# Patient Record
Sex: Male | Born: 2000
Health system: Southern US, Community
[De-identification: ages and names within clinical notes are randomized; demographics above are authoritative.]

## PROBLEM LIST (undated history)

## (undated) ENCOUNTER — Ambulatory Visit (HOSPITAL_COMMUNITY): Disposition: A | Discharge status: Home / Self Care | Payer: Self-pay

## (undated) DIAGNOSIS — J302 Other seasonal allergic rhinitis: Secondary | ICD-10-CM

## (undated) HISTORY — PX: APPENDECTOMY: SHX54

---
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
## 2021-01-06 DIAGNOSIS — Z0100 Encounter for examination of eyes and vision without abnormal findings: Secondary | ICD-10-CM | POA: Diagnosis not present

## 2021-01-10 DIAGNOSIS — H5213 Myopia, bilateral: Secondary | ICD-10-CM | POA: Diagnosis not present

## 2021-01-27 DIAGNOSIS — Z0100 Encounter for examination of eyes and vision without abnormal findings: Secondary | ICD-10-CM | POA: Diagnosis not present

## 2021-01-31 ENCOUNTER — Other Ambulatory Visit: Payer: Self-pay

## 2021-01-31 MED ORDER — CETIRIZINE HCL 10 MG PO TABS: 10.0000 mg | ORAL_TABLET | Freq: Every day | ORAL | 3 refills | Status: DC

## 2021-03-15 ENCOUNTER — Ambulatory Visit (HOSPITAL_COMMUNITY): Payer: Self-pay

## 2021-09-30 ENCOUNTER — Ambulatory Visit (HOSPITAL_COMMUNITY)
Admission: EM | Admit: 2021-09-30 | Discharge: 2021-09-30 | Disposition: A | Discharge status: Home / Self Care | Payer: Medicaid Other | Attending: Student | Admitting: Student

## 2021-09-30 ENCOUNTER — Other Ambulatory Visit: Payer: Self-pay

## 2021-09-30 ENCOUNTER — Ambulatory Visit (HOSPITAL_COMMUNITY)
Admission: EM | Admit: 2021-09-30 | Discharge: 2021-09-30 | Disposition: A | Discharge status: Home / Self Care | Payer: Self-pay

## 2021-09-30 ENCOUNTER — Encounter (HOSPITAL_COMMUNITY): Payer: Self-pay

## 2021-09-30 DIAGNOSIS — R21 Rash and other nonspecific skin eruption: Secondary | ICD-10-CM | POA: Diagnosis not present

## 2021-09-30 MED ORDER — PREDNISONE 20 MG PO TABS: 20.0000 mg | ORAL_TABLET | Freq: Every day | ORAL | 0 refills | Status: AC

## 2021-09-30 MED ORDER — DIPHENHYDRAMINE HCL 25 MG PO TABS: 25.0000 mg | ORAL_TABLET | Freq: Four times a day (QID) | ORAL | 0 refills | Status: DC | PRN

## 2021-09-30 NOTE — ED Provider Notes (Signed)
[facility]    CSN: [id number] Arrival date & time: 09/30/21  1943      History   Chief Complaint Chief Complaint  Patient presents with   Rash    HPI Dale Moyer is a 20 y.o. male presenting with urticarial rash for about 1 week.  Medical history noncontributory.  Describes itchy rash on back, arms, chest for about 1 week.  Has not tried any interventions for the symptoms.  He did have viral symptoms before this started, including cough and congestion, but this has resolved.  He is VERY concerned for monkey pox.  He is adamant that he is not having unprotected anal intercourse with men, and denies other unprotected sexual intercourse.  Denies facial involvement, though there is a small uncomfortable lesion under his tongue. Denies changes in routine or new products.  HPI  Past Medical History:  Diagnosis Date   Seasonal allergies     Patient Active Problem List   Diagnosis Date Noted   Generalized body aches 11/13/2020   External hemorrhoids without complication 11/13/2020   Depression, major, single episode, moderate (HCC) 12/28/2019   Sprain of right rotator cuff capsule 12/28/2019   Seasonal allergies 09/07/2017    Past Surgical History:  Procedure Laterality Date   APPENDECTOMY         Home Medications    Prior to Admission medications   Medication Sig Start Date End Date Taking? Authorizing Provider  diphenhydrAMINE (BENADRYL) 25 MG tablet Take 1 tablet (25 mg total) by mouth every 6 (six) hours as needed. This medication can cause drowsiness. 09/30/21  Yes Rhys Martini, PA-C  predniSONE (DELTASONE) 20 MG tablet Take 1 tablet (20 mg total) by mouth daily for 5 days. 09/30/21 10/05/21 Yes Rhys Martini, PA-C  cetirizine (ZYRTEC) 10 MG tablet Take 1 tablet (10 mg total) by mouth daily. 01/31/21   Shirlean Mylar, MD  ibuprofen (ADVIL) 200 MG tablet Take 2 tablets (400 mg total) by mouth every 6 (six) hours as needed for mild pain or moderate  pain. 12/28/19   Garnette Gunner, MD    Family History Family History  Problem Relation Age of Onset   Hypertension Mother    Hypertension Father    Diabetes Maternal Grandmother    Cancer - Cervical Maternal Grandmother    Diabetes Maternal Grandfather    Heart attack Maternal Grandfather    Heart disease Maternal Uncle    Breast cancer Maternal Aunt     Social History Social History   Tobacco Use   Smoking status: Every Day    Types: E-cigarettes   Smokeless tobacco: Never  Substance Use Topics   Alcohol use: Yes   Drug use: Yes    Frequency: 7.0 times per week    Types: Marijuana     Allergies   Patient has no known allergies.   Review of Systems Review of Systems  Skin:  Positive for rash.  All other systems reviewed and are negative.   Physical Exam Triage Vital Signs ED Triage Vitals  Enc Vitals Group     BP 09/30/21 2018 114/71     Pulse Rate 09/30/21 2018 85     Resp 09/30/21 2018 16     Temp 09/30/21 2018 98.9 F (37.2 C)     Temp Source 09/30/21 2018 Oral     SpO2 --      Weight --      Height --      Head Circumference --  Peak Flow --      Pain Score 09/30/21 2017 0     Pain Loc --      Pain Edu? --      Excl. in GC? --    No data found.  Updated Vital Signs BP 114/71 (BP Location: Left Arm)   Pulse 85   Temp 98.9 F (37.2 C) (Oral)   Resp 16   Visual Acuity Right Eye Distance:   Left Eye Distance:   Bilateral Distance:    Right Eye Near:   Left Eye Near:    Bilateral Near:     Physical Exam Vitals reviewed.  Constitutional:      General: He is not in acute distress.    Appearance: Normal appearance. He is not ill-appearing or diaphoretic.  HENT:     Head: Normocephalic and atraumatic.     Mouth/Throat:     Comments: Small canker sore under tongue- frenulum. No trismus, drooling, sore throat, voice changes, swelling underneath the tongue, swelling underneath the jaw, neck stiffness.  Cardiovascular:     Rate  and Rhythm: Normal rate and regular rhythm.     Heart sounds: Normal heart sounds.  Pulmonary:     Effort: Pulmonary effort is normal.     Breath sounds: Normal breath sounds.  Skin:    General: Skin is warm.     Comments: Scattered faint maculopapular rash back, chest, forearms. No facial involvement or swelling, airway is patent. No burrows interdigital webs.  Neurological:     General: No focal deficit present.     Mental Status: He is alert and oriented to person, place, and time.  Psychiatric:        Mood and Affect: Mood is anxious.        Behavior: Behavior normal.        Thought Content: Thought content normal.        Judgment: Judgment normal.       UC Treatments / Results  Labs (all labs ordered are listed, but only abnormal results are displayed) Labs Reviewed - No data to display  EKG   Radiology No results found.  Procedures Procedures (including critical care time)  Medications Ordered in UC Medications - No data to display  Initial Impression / Assessment and Plan / UC Course  I have reviewed the triage vital signs and the nursing notes.  Pertinent labs & imaging results that were available during my care of the patient were reviewed by me and considered in my medical decision making (see chart for details).     This patient is a very pleasant 20 y.o. year old male presenting with what appears to be contact dermatitis. He is VERY concerned for monkeypox. Discussed that he is low risk and symptoms are not consistent with this. He is in agreement that we will defer testing at this time. Low-dose prednisone and benedryl as below. ED return precautions discussed. Patient verbalizes understanding and agreement.  .   Final Clinical Impressions(s) / UC Diagnoses   Final diagnoses:  Rash and nonspecific skin eruption     Discharge Instructions      -Prednisone,  1 pill taken in the morning x5 days. Try taking this earlier in the day as it can give you  energy. Avoid NSAIDs like ibuprofen and alleve while taking this medication as they can increase your risk of stomach upset and even GI bleeding when in combination with a steroid. You can continue tylenol (acetaminophen) up to 1000mg  3x daily. -  Benedryl (diphenhydramine) 25-50mg  (1-2 pills) as needed for itching, up to every 6 hours.  This medication will cause drowsiness.     ED Prescriptions     Medication Sig Dispense Auth. Provider   predniSONE (DELTASONE) 20 MG tablet Take 1 tablet (20 mg total) by mouth daily for 5 days. 5 tablet Rhys Martini, PA-C   diphenhydrAMINE (BENADRYL) 25 MG tablet Take 1 tablet (25 mg total) by mouth every 6 (six) hours as needed. This medication can cause drowsiness. 30 tablet Rhys Martini, PA-C      PDMP not reviewed this encounter.   Rhys Martini, PA-C 09/30/21 2032

## 2021-09-30 NOTE — ED Triage Notes (Signed)
Pt reports itching rash stared in hand spread to ars and torso x 1 week; bump under the tongue.

## 2021-09-30 NOTE — Discharge Instructions (Addendum)
-  Prednisone,  1 pill taken in the morning x5 days. Try taking this earlier in the day as it can give you energy. Avoid NSAIDs like ibuprofen and alleve while taking this medication as they can increase your risk of stomach upset and even GI bleeding when in combination with a steroid. You can continue tylenol (acetaminophen) up to 1000mg  3x daily. -Benedryl (diphenhydramine) 25-50mg  (1-2 pills) as needed for itching, up to every 6 hours.  This medication will cause drowsiness.

## 2021-10-02 ENCOUNTER — Other Ambulatory Visit (HOSPITAL_COMMUNITY)
Admission: RE | Admit: 2021-10-02 | Discharge: 2021-10-02 | Disposition: A | Discharge status: Home / Self Care | Payer: Medicaid Other | Source: Ambulatory Visit | Attending: Family Medicine | Admitting: Family Medicine

## 2021-10-02 ENCOUNTER — Ambulatory Visit (INDEPENDENT_AMBULATORY_CARE_PROVIDER_SITE_OTHER): Payer: Medicaid Other | Admitting: Family Medicine

## 2021-10-02 ENCOUNTER — Other Ambulatory Visit: Payer: Self-pay

## 2021-10-02 VITALS — BP 116/74 | HR 70 | Ht 64.0 in | Wt 110.4 lb

## 2021-10-02 DIAGNOSIS — Z202 Contact with and (suspected) exposure to infections with a predominantly sexual mode of transmission: Secondary | ICD-10-CM | POA: Insufficient documentation

## 2021-10-02 DIAGNOSIS — R21 Rash and other nonspecific skin eruption: Secondary | ICD-10-CM

## 2021-10-02 DIAGNOSIS — K146 Glossodynia: Secondary | ICD-10-CM

## 2021-10-02 LAB — POCT RAPID STREP A (OFFICE): Rapid Strep A Screen: NEGATIVE

## 2021-10-02 MED ORDER — DOXYCYCLINE HYCLATE 100 MG PO TABS: 100.0000 mg | ORAL_TABLET | Freq: Two times a day (BID) | ORAL | 0 refills | Status: DC

## 2021-10-02 MED ORDER — VALACYCLOVIR HCL 1 G PO TABS: 1000.0000 mg | ORAL_TABLET | Freq: Two times a day (BID) | ORAL | 0 refills | Status: AC

## 2021-10-02 MED ORDER — CEFTRIAXONE SODIUM 250 MG IJ SOLR
250.0000 mg | Freq: Once | INTRAMUSCULAR | Status: AC
  Administered 2021-10-03: 250 mg via INTRAMUSCULAR

## 2021-10-02 MED ORDER — NYSTATIN 100000 UNIT/ML MT SUSP: 5.0000 mL | Freq: Three times a day (TID) | OROMUCOSAL | 0 refills | Status: DC

## 2021-10-02 NOTE — Progress Notes (Signed)
SUBJECTIVE:   CHIEF COMPLAINT / HPI: worsening rash  Rash started 5 days ago on both palms, then spread to arms, chest and back.  Initially palms itching but now more burning sensation.  Was seen in ED and treated with Prednisone and Benadryl for presumed contact dermatitis.  Patient reports taking one dose of steroid and this morning rash appears to be worse now on lower legs and soles of feet.  Reports subjective fever on initial day of breakout as well as swollen painful lymph nodes which has since resolved.  No recent travel outside country.  Was in [place] last week and attended friendsgiving.  Stayed with older brother who has no similar rash.  Reports no one else in family has similar symptoms.  Shares bed with younger brother who has no rash.  [place] has been there frequently before breakout.  No changes in body wash, lotions laundry detergents.  Sexually active with females only, does not partake in oral sex.  Last sexual encounter 1-2 weeks ago without condom use.  3 sexual partners in last 6 months and condom use with one.  Unknown if partners have any similar symptoms.  Reports a small non tender pimple with white head on dorsum of penile shaft about 5 months ago that popped and resolved.  Initially had not reported any mucosal concerns but when mask removed noted perioral erythema and yellow crusting.  Patient reports that lips are dry and cracks because he bites lips.  He reports he has been having some difficulty swallowing due to painful mouth.    PERTINENT  PMH / PSH:  Vaping THC use   OBJECTIVE:   BP 116/74   Pulse 70   Ht 5\' 4"  (1.626 m)   Wt 110 lb 6.4 oz (50.1 kg)   SpO2 100%   BMI 18.95 kg/m    General: Alert, no acute distress HEENT: periorbital erythema, cracking on lips, mucus membranes notable for multiple oral ulcers, possible herpetic lesions with sloughing mucosal tissue extending to posterior pharynx.  No  lymphadenopathy appreciated           Cardio: Normal S1 and S2, RRR, no r/m/g Pulm: CTAB, normal work of breathing Abdomen: Bowel sounds normal. Abdomen soft and non-tender. No hepatosplenomegaly Derm: Diffuse maculopapular rash extending from trunk to lower extremities, including palmer surface of hands bilaterally and bilateral soles of feet.                         ASSESSMENT/PLAN:   Rash Diffuse rash on body for 5 days with subjective fever also affecting palmar surface of hand and soles of feet.  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  Other differentials include monkey pox, HIV, HSV and GC/C.  Considered Bechets given penile lesion and mouth sores but less likely given no joint involvement.  -Monkeypox PCR, RPR, HIV, HSV of left wrist, HSV oral swab, GC &C oral swab, Urine GC&C, Rapid Strep, CBC, CMet today -Magic mouthwash TID x 5 days -Spoke with Dr , ID.  She has reviewed images and agrees that most likely syphilis.  Recommends treating with Antiviral for HSV, prophylactic treatment with Ceftriaxone and Doxycycline for GC&C and urgent care referral to ID in am. -Valcyclovir 1g BID x 10 days -Ceftriaxone 500 mg IM x1, patient will come to RN clinic  tomorrow at 9am to receive injection.  He is aware of this.   -Doxycycline 100 mg BID x 7 days -Referral sent to ID, have sent message to CMA to make urgent appointment in am.   -Discontinue Steroids -Can continue benadryl at night if itching -Will wait for RPR titre before initiation of Bicillin. -Follow up up appointment scheduled for 11/21 with PCP.       Dana Allan, MD [facility]

## 2021-10-02 NOTE — Patient Instructions (Signed)
Thank you for coming to see me today. It was a pleasure.   We will get some labs today.  If they are abnormal or we need to do something about them, I will call you.  If they are normal, I will send you a message on MyChart (if it is active) or a letter in the mail.  If you don't hear from [place] in 2 weeks, please call the office at the number below.   Please follow-up with PCP as needed  If you have any questions or concerns, please do not hesitate to call the office at [phone].  Best,   Dana Allan, MD

## 2021-10-02 NOTE — Progress Notes (Signed)
stre

## 2021-10-03 ENCOUNTER — Ambulatory Visit (INDEPENDENT_AMBULATORY_CARE_PROVIDER_SITE_OTHER): Payer: Medicaid Other | Admitting: Internal Medicine

## 2021-10-03 ENCOUNTER — Telehealth: Payer: Self-pay

## 2021-10-03 ENCOUNTER — Ambulatory Visit (INDEPENDENT_AMBULATORY_CARE_PROVIDER_SITE_OTHER): Payer: Medicaid Other

## 2021-10-03 ENCOUNTER — Encounter: Payer: Self-pay | Admitting: Family Medicine

## 2021-10-03 ENCOUNTER — Other Ambulatory Visit: Payer: Self-pay

## 2021-10-03 VITALS — BP 117/82 | HR 86 | Temp 97.7°F | Resp 17 | Ht 64.0 in | Wt 111.6 lb

## 2021-10-03 DIAGNOSIS — R21 Rash and other nonspecific skin eruption: Secondary | ICD-10-CM

## 2021-10-03 DIAGNOSIS — Z202 Contact with and (suspected) exposure to infections with a predominantly sexual mode of transmission: Secondary | ICD-10-CM | POA: Diagnosis not present

## 2021-10-03 LAB — CBC WITH DIFFERENTIAL/PLATELET
Basophils Absolute: 0 10*3/uL (ref 0.0–0.2)
Basos: 0 %
EOS (ABSOLUTE): 0 10*3/uL (ref 0.0–0.4)
Eos: 0 %
Hematocrit: 42.5 % (ref 37.5–51.0)
Hemoglobin: 14.7 g/dL (ref 13.0–17.7)
Immature Grans (Abs): 0 10*3/uL (ref 0.0–0.1)
Immature Granulocytes: 0 %
Lymphocytes Absolute: 1.3 10*3/uL (ref 0.7–3.1)
Lymphs: 19 %
MCH: 31.1 pg (ref 26.6–33.0)
MCHC: 34.6 g/dL (ref 31.5–35.7)
MCV: 90 fL (ref 79–97)
Monocytes Absolute: 0.6 10*3/uL (ref 0.1–0.9)
Monocytes: 9 %
Neutrophils Absolute: 4.9 10*3/uL (ref 1.4–7.0)
Neutrophils: 72 %
Platelets: 243 10*3/uL (ref 150–450)
RBC: 4.73 x10E6/uL (ref 4.14–5.80)
RDW: 11.9 % (ref 11.6–15.4)
WBC: 6.9 10*3/uL (ref 3.4–10.8)

## 2021-10-03 LAB — T PALLIDUM ANTIBODY, EIA: T pallidum Antibody, EIA: NEGATIVE

## 2021-10-03 LAB — COMPREHENSIVE METABOLIC PANEL
ALT: 20 IU/L (ref 0–44)
AST: 37 IU/L (ref 0–40)
Albumin/Globulin Ratio: 1.3 (ref 1.2–2.2)
Albumin: 5.1 g/dL (ref 4.1–5.2)
Alkaline Phosphatase: 62 IU/L (ref 51–125)
BUN/Creatinine Ratio: 18 (ref 9–20)
BUN: 13 mg/dL (ref 6–20)
Bilirubin Total: 0.9 mg/dL (ref 0.0–1.2)
CO2: 22 mmol/L (ref 20–29)
Calcium: 9.9 mg/dL (ref 8.7–10.2)
Chloride: 99 mmol/L (ref 96–106)
Creatinine, Ser: 0.71 mg/dL — ABNORMAL LOW (ref 0.76–1.27)
Globulin, Total: 3.8 g/dL (ref 1.5–4.5)
Glucose: 96 mg/dL (ref 70–99)
Potassium: 4.3 mmol/L (ref 3.5–5.2)
Sodium: 140 mmol/L (ref 134–144)
Total Protein: 8.9 g/dL — ABNORMAL HIGH (ref 6.0–8.5)
eGFR: 136 mL/min/{1.73_m2} (ref >59)

## 2021-10-03 LAB — RPR W/REFLEX TO TREPSURE: RPR: NONREACTIVE

## 2021-10-03 LAB — HIV ANTIBODY (ROUTINE TESTING W REFLEX): HIV Screen 4th Generation wRfx: NONREACTIVE

## 2021-10-03 MED ORDER — DOXYCYCLINE HYCLATE 100 MG PO TABS: 100.0000 mg | ORAL_TABLET | Freq: Two times a day (BID) | ORAL | 0 refills | Status: AC

## 2021-10-03 NOTE — Telephone Encounter (Signed)
Patient calls nurse line asking for clarification on magic mouth wash. Patient states the directions saw "swish and swallow." However patient has reservations about that. Patient advised to call his pharmacy for recommendations of use. Patient agreed with plan.

## 2021-10-03 NOTE — Addendum Note (Signed)
Addended by: Harley Alto on: 10/03/2021 11:53 AM   Modules accepted: Orders

## 2021-10-03 NOTE — Patient Instructions (Signed)
We don't know what this rash is, it could be viral or bacteria   Will swab your throat and get blood for more testing   Please sign up with my chart to follow your labs and send communication to [place] (or we to you)....    Based on the labs we can discuss treatment (if anything needed at all)  Otherwise follow up with [place] in around 6-8 weeks to test for other late-showing virus such as hepatitis testing

## 2021-10-03 NOTE — Progress Notes (Signed)
Patient presents to nurse clinic for IM ceftriaxone injections, per Dr. Clent Ridges.   Administered 250 mg ceftriaxone in RUOQ and 250 mg ceftriaxone in LUOQ.  Patient tolerated injections well. Observed 15 minutes post administration, no adverse reaction noted. Patient to follow up with Dr. Clent Ridges on 11/21.  Veronda Prude, RN

## 2021-10-03 NOTE — Assessment & Plan Note (Addendum)
Diffuse rash on body for 5 days with subjective fever also affecting palmar surface of hand and soles of feet.  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  Other differentials include monkey pox, HIV, HSV and GC/C.  Considered Bechets given penile lesion and mouth sores but less likely given no joint involvement.  -Monkeypox PCR, RPR, HIV, HSV of left wrist, HSV oral swab, GC &C oral swab, Urine GC&C, Rapid Strep, CBC, CMet today -Magic mouthwash TID x 5 days -Spoke with Dr Thedore Mins, ID.  She has reviewed images and agrees that most likely syphilis.  Recommends treating with Antiviral for HSV, prophylactic treatment with Ceftriaxone and Doxycycline for GC&C and urgent care referral to ID in am. -Valcyclovir 1g BID x 10 days -Ceftriaxone 500 mg IM x1, patient will come to RN clinic tomorrow at 9am to receive injection.  He is aware of this.   -Doxycycline 100 mg BID x 7 days -Referral sent to ID, have sent message to CMA to make urgent appointment in am.   -Discontinue Steroids -Can continue benadryl at night if itching -Will wait for RPR titre before initiation of Bicillin. -Follow up up appointment scheduled for 11/21 with PCP.

## 2021-10-03 NOTE — Telephone Encounter (Signed)
Perfect - thank you so much for the heads up!

## 2021-10-03 NOTE — Telephone Encounter (Signed)
Received message from Dr. Thedore Mins requesting urgent appointment to evaluate for monkeypox treatment. Patient has been seen by urgent care and family medicine. Monkeypox testing has already been collected and results are pending.   Patient accepts appointment for today with Dr. Renold Don.   Sandie Ano, RN

## 2021-10-03 NOTE — Progress Notes (Signed)
[place] [facility]  Reason for Consult:rash Referring Provider: self    Patient Active Problem List   Diagnosis Date Noted   Generalized body aches 11/13/2020   External hemorrhoids without complication 53/74/8270   Depression, major, single episode, moderate ([place]) 12/28/2019   Sprain of right rotator cuff capsule 12/28/2019   Seasonal allergies 09/07/2017      HPI: Dale Moyer is a 20 y.o. male with sexual exposure and 5 days of initially truncal/ext rash now involving mouth here for evaluation   Subjective f/c associated No headache Cervical node tenderness The rash now involved throat with mucositis  Palms sole involved  Saw ed and fam practice clinic within last 2 days pending rpr, monkey pox testing, hsv swab and also gc/chlam screening Hiv antibody from 11/18 negative  Doing well otherwise  Review of Systems: ROS All other ros negative     cefTRIAXone  250 mg Intramuscular Once   cefTRIAXone  250 mg Intramuscular Once    Past Medical History:  Diagnosis Date   Seasonal allergies     Social History   Tobacco Use   Smoking status: Every Day    Types: E-cigarettes   Smokeless tobacco: Never  Substance Use Topics   Alcohol use: Yes   Drug use: Yes    Frequency: 7.0 times per week    Types: Marijuana    Family History  Problem Relation Age of Onset   Hypertension Mother    Hypertension Father    Diabetes Maternal Grandmother    Cancer - Cervical Maternal Grandmother    Diabetes Maternal Grandfather    Heart attack Maternal Grandfather    Heart disease Maternal Uncle    Breast cancer Maternal Aunt     No Known Allergies  OBJECTIVE: Vitals:   10/03/21 1046  BP: 117/82  Pulse: 86  Resp: 17  Temp: 97.7 F (36.5 C)  TempSrc: Oral  SpO2: 95%  Weight: 111 lb 9.6 oz (50.6 kg)  Height: $Remove'5\' 4"'zivxfFK$  (1.626 m)   Body mass index is 19.16 kg/m.   Physical Exam General/constitutional: no distress,  pleasant HEENT: tender submandibular node; Normocephalic, PER, Conj Clear, EOMI, Oropharynx clear Neck supple CV: rrr no mrg Lungs: clear to auscultation, normal respiratory effort Abd: Soft, Nontender Ext: no edema Skin: see pictures for rash Neuro: nonfocal MSK: no peripheral joint swelling/tenderness/warmth; back spines nontender                Lab: Lab Results  Component Value Date   WBC 6.9 10/02/2021   HGB 14.7 10/02/2021   HCT 42.5 10/02/2021   MCV 90 10/02/2021   PLT 243 78/67/5449   Last metabolic panel Lab Results  Component Value Date   GLUCOSE 96 10/02/2021   NA 140 10/02/2021   K 4.3 10/02/2021   CL 99 10/02/2021   CO2 22 10/02/2021   BUN 13 10/02/2021   CREATININE 0.71 (L) 10/02/2021   EGFR 136 10/02/2021   CALCIUM 9.9 10/02/2021   PROT 8.9 (H) 10/02/2021   ALBUMIN 5.1 10/02/2021   LABGLOB 3.8 10/02/2021   AGRATIO 1.3 10/02/2021   BILITOT 0.9 10/02/2021   ALKPHOS 62 10/02/2021   AST 37 10/02/2021   ALT 20 10/02/2021    Microbiology:  Serology:  Imaging:   Assessment/plan: Problem List Items Addressed This Visit   None Visit Diagnoses     Rash and nonspecific skin eruption    -  Primary   Relevant Orders  RESPIRATORY VIRUS PCR, PANEL   HIV 1 RNA quant-no reflex-bld   Mycoplasma pneumoniae Ab, IgM/IgG   Echovirus panel      Doesn't appear monkey pox Wide ddx but syphilis highest and antiretroviral syndrome, cmv/ebv Other consideration nonspecific viral syndrome/mycloplasma   -f/u labs sent -hiv rna -ebv/cmv testing -mycoplasma/coxackie serology; resp viral pcr -f/u 1-2 months for hepatitis testing -will communicate if any test positive      Follow-up: No follow-ups on file.  Jabier Mutton, [place] for [place] [phone] pager   [phone] cell 10/03/2021, 11:03 AM

## 2021-10-04 LAB — MONKEYPOX VIRUS DNA, QUALITATIVE REAL-TIME PCR: Orthopoxvirus DNA, QL PCR: NOT DETECTED

## 2021-10-05 LAB — HERPES SIMPLEX VIRUS CULTURE

## 2021-10-06 ENCOUNTER — Other Ambulatory Visit: Payer: Self-pay

## 2021-10-06 ENCOUNTER — Encounter: Payer: Self-pay | Admitting: Family Medicine

## 2021-10-06 ENCOUNTER — Ambulatory Visit (INDEPENDENT_AMBULATORY_CARE_PROVIDER_SITE_OTHER): Payer: Medicaid Other | Admitting: Family Medicine

## 2021-10-06 VITALS — BP 106/65 | HR 71 | Ht 64.0 in | Wt 110.6 lb

## 2021-10-06 DIAGNOSIS — R21 Rash and other nonspecific skin eruption: Secondary | ICD-10-CM | POA: Diagnosis not present

## 2021-10-06 LAB — URINE CYTOLOGY ANCILLARY ONLY
Chlamydia: NEGATIVE
Comment: NEGATIVE
Comment: NEGATIVE
Comment: NORMAL
Neisseria Gonorrhea: NEGATIVE
Trichomonas: NEGATIVE

## 2021-10-06 LAB — CERVICOVAGINAL ANCILLARY ONLY
Chlamydia: NEGATIVE
Comment: NEGATIVE
Comment: NORMAL
Neisseria Gonorrhea: NEGATIVE

## 2021-10-06 MED ORDER — NYSTATIN 100000 UNIT/ML MT SUSP: 5.0000 mL | Freq: Three times a day (TID) | OROMUCOSAL | 0 refills | Status: AC

## 2021-10-06 NOTE — Progress Notes (Signed)
    SUBJECTIVE:   CHIEF COMPLAINT / HPI: follow up rash  Diffuse rash improving although has now started to blister on palms of hands.  Remains slightly itchy.  Oral ulcers have improved and reports now able to eat better.  Needing more mouthwash that has significantly helped.  Seen by ID last Friday and more blood work was ordered at that time.  Has follow up appointment in Jan 2023.  Denies any more fevers, cough, chest pain,  joint pain, abdominal pain, nausea/vomiting, diarrhea or urinary symptoms.  PERTINENT  PMH / PSH:  Vaping THC use   OBJECTIVE:   BP 106/65   Pulse 71   Ht 5\' 4"  (1.626 m)   Wt 110 lb 9.6 oz (50.2 kg)   SpO2 98%   BMI 18.98 kg/m    General: Alert, no acute distress Cardio: Normal S1 and S2, RRR, no r/m/g Pulm: CTAB, normal work of breathing Palmer surfaces: small hardened fluid filled areas spread diffusely.  Maculopapular rash fading    ASSESSMENT/PLAN:   Rash Rash of unknown etiology, likely viral in origin but workup still pending.  RPR negative.  GC&C, HIV, Hep C, RPP negative.  CBC and CMet unrevealing.  Mycoplasma IgG positive, EBV IgG positive but unknown exposure.  Less likely Mycoplasma rash given M.Pneumonia, IgM negative. Also considered THC laced with unknown substance causing a Johnson's like rash and now improving. -Refill Magic Mouthwash -Follow up with Infectious Disease as scheduled -Strict return precautions provided -Encouraged safe sex and cessation of Vaping and THC use -Referral sent to Allergist for evaluation  -If lesions continue can consider biopsy at next visit -Follow up with PCP as needed    Andria Meuse, MD [facility]

## 2021-10-06 NOTE — Patient Instructions (Signed)
Thank you for coming to see me today. It was a pleasure.   Referral sent to allergy specialist.  They will call you with an appointment.  Refill sent for magic mouth wash.  Use as directed  Labs so far have been negative.  Some labs still waiting for results.  Follow up with Infectious Disease as scheduled  Please follow-up with PCP as needed  If you have any questions or concerns, please do not hesitate to call the office at [phone].  Best,   Dana Allan, MD

## 2021-10-12 ENCOUNTER — Encounter: Payer: Self-pay | Admitting: Family Medicine

## 2021-10-12 LAB — ECHOVIRUS ANTIBODY PANEL,SERUM
ECHOVIRUS 11 AB: <1:8 {titer}
ECHOVIRUS 30 AB: <1:8 {titer}
ECHOVIRUS 4 AB: <1:8 {titer}
ECHOVIRUS 7 AB: <1:8 {titer}
ECHOVIRUS 9 AB: <1:8 {titer}

## 2021-10-12 LAB — HIV-1 RNA QUANT-NO REFLEX-BLD
HIV 1 RNA Quant: NOT DETECTED Copies/mL
HIV-1 RNA Quant, Log: NOT DETECTED Log cps/mL

## 2021-10-12 LAB — RESPIRATORY VIRUS PANEL

## 2021-10-12 LAB — EPSTEIN-BARR VIRUS VCA ANTIBODY PANEL
EBV NA IgG: 401 U/mL — ABNORMAL HIGH
EBV VCA IgG: 188 U/mL — ABNORMAL HIGH
EBV VCA IgM: <36 U/mL

## 2021-10-12 LAB — EPSTEIN-BARR VIRUS EARLY D ANTIGEN ANTIBODY, IGG: EBV EA IgG: <9 U/mL (ref <9.00)

## 2021-10-12 LAB — CMV IGM: CMV IgM: <30 AU/mL

## 2021-10-12 LAB — MYCOPLASMA PNEUMONIAE AB, IGM/IGG
M. pneumoniae Ab, IgG: 3.13 — ABNORMAL HIGH (ref <=0.90)
Mycoplasma pneumo IgM: 620 U/mL (ref <770)

## 2021-10-12 LAB — CYTOMEGALOVIRUS ANTIBODY, IGG: Cytomegalovirus Ab-IgG: <0.6 U/mL

## 2021-10-12 NOTE — Assessment & Plan Note (Addendum)
Rash of unknown etiology, likely viral in origin but workup still pending.  RPR negative.  GC&C, HIV, Hep C, RPP negative.  CBC and CMet unrevealing.  Mycoplasma IgG positive, EBV IgG positive but unknown exposure.  Less likely Mycoplasma rash given M.Pneumonia, IgM negative. Also considered THC laced with unknown substance causing a Andria Meuse Johnson's like rash and now improving. -Refill Magic Mouthwash -Follow up with Infectious Disease as scheduled -Strict return precautions provided -Encouraged safe sex and cessation of Vaping and THC use -Referral sent to Allergist for evaluation  -If lesions continue can consider biopsy at next visit -Follow up with PCP as needed

## 2021-10-28 ENCOUNTER — Other Ambulatory Visit: Payer: Self-pay

## 2021-10-28 ENCOUNTER — Ambulatory Visit: Payer: Medicaid Other | Admitting: Family Medicine

## 2021-11-19 ENCOUNTER — Ambulatory Visit (INDEPENDENT_AMBULATORY_CARE_PROVIDER_SITE_OTHER): Payer: Medicaid Other | Admitting: Internal Medicine

## 2021-11-19 ENCOUNTER — Other Ambulatory Visit: Payer: Self-pay

## 2021-11-19 ENCOUNTER — Encounter: Payer: Self-pay | Admitting: Internal Medicine

## 2021-11-19 VITALS — BP 123/78 | HR 97 | Resp 16 | Ht 64.01 in | Wt 116.2 lb

## 2021-11-19 DIAGNOSIS — R21 Rash and other nonspecific skin eruption: Secondary | ICD-10-CM | POA: Diagnosis not present

## 2021-11-19 NOTE — Patient Instructions (Signed)
Repeat blood test today; if these are negative (hiv and syphilis screen) you do not need to worry the rash was related to that   All your other testing (monkey pox, viral, mycoplasma mucositis, ebv, cmv) indicate no active infection from those   If all test remains negative, likely a viral process that our tests don't capture.

## 2021-11-19 NOTE — Progress Notes (Signed)
[facility]  Reason for Consult:rash Referring Provider: self    Patient Active Problem List   Diagnosis Date Noted   Rash 10/03/2021   Generalized body aches 11/13/2020   External hemorrhoids without complication 05/39/7673   Depression, major, single episode, moderate ([place]) 12/28/2019   Sprain of right rotator cuff capsule 12/28/2019   Seasonal allergies 09/07/2017      HPI: Dale Moyer is a 21 y.o. male with sexual exposure and 5 days of initially truncal/ext rash now involving mouth here for follow up   11/19/21 id clinic f/u Reviewed labs again Negative respiratory viral pcr, mycoplasma igm, hiv rna quant, ebv/cmv serology --> all negative or suggest prior exposure (cmv/ebv) 11/17 monkey pox testing and syphilis screen were negative    He saw me 11/21 with diffuse rash involving skin/oral mucosa --------------------------- Subjective f/c associated No headache Cervical node tenderness The rash now involved throat with mucositis  Palms sole involved  [facility]aw [facility] within last 2 days pending rpr, monkey pox testing, hsv swab and also gc/chlam screening Hiv antibody from 11/18 negative  Doing well otherwise  Review of Systems: ROS All other ros negative       Past Medical History:  Diagnosis Date   Seasonal allergies     Social History   Tobacco Use   Smoking status: Every Day    Types: E-cigarettes   Smokeless tobacco: Never  Substance Use Topics   Alcohol use: Yes   Drug use: Yes    Frequency: 7.0 times per week    Types: Marijuana    Family History  Problem Relation Age of Onset   Hypertension Mother    Hypertension Father    Diabetes Maternal Grandmother    Cancer - Cervical Maternal Grandmother    Diabetes Maternal Grandfather    Heart attack Maternal Grandfather    Heart disease Maternal Uncle    Breast cancer Maternal Aunt     No Known Allergies  OBJECTIVE: Vitals:    11/19/21 1111  BP: 123/78  Pulse: 97  Resp: 16  SpO2: 100%  Weight: 116 lb 3.2 oz (52.7 kg)  Height: 5' 4.01" (1.626 m)   Body mass index is 19.94 kg/m.   Physical Exam General/constitutional: no distress, pleasant HEENT: Normocephalic, PER, Conj Clear, EOMI, Oropharynx clear Neck supple CV: rrr no mrg Lungs: clear to auscultation, normal respiratory effort Abd: Soft, Nontender Ext: no edema Skin: No Rash today -- chronic acne change on the face Neuro: nonfocal MSK: no peripheral joint swelling/tenderness/warmth; back spines nontender    11/21 pictures:                Lab: Lab Results  Component Value Date   WBC 6.9 10/02/2021   HGB 14.7 10/02/2021   HCT 42.5 10/02/2021   MCV 90 10/02/2021   PLT 243 41/93/7902   Last metabolic panel Lab Results  Component Value Date   GLUCOSE 96 10/02/2021   NA 140 10/02/2021   K 4.3 10/02/2021   CL 99 10/02/2021   CO2 22 10/02/2021   BUN 13 10/02/2021   CREATININE 0.71 (L) 10/02/2021   EGFR 136 10/02/2021   CALCIUM 9.9 10/02/2021   PROT 8.9 (H) 10/02/2021   ALBUMIN 5.1 10/02/2021   LABGLOB 3.8 10/02/2021   AGRATIO 1.3 10/02/2021   BILITOT 0.9 10/02/2021   ALKPHOS 62 10/02/2021   AST 37 10/02/2021   ALT 20 10/02/2021    Microbiology:  Serology:  Imaging:   Assessment/plan: Problem List Items Addressed This Visit   None Visit Diagnoses     Skin rash    -  Primary     Std testings/monkey pox screen negative   Rash resolved Will repeat hiv/rpr  Discussed prep therapy with patient who doesn't think he needs   -hiv/rpr -- can review via mychart  -no need for f/u at this time  I have spent a total of 20 minutes of face-to-face and non-face-to-face time, excluding clinical staff time, preparing to see patient, ordering tests and/or medications, and provide counseling the patient    Follow-up: No follow-ups on file.  Jabier Mutton, [place] [facility] [phone] pager   [phone] cell 11/19/2021, 11:23 AM

## 2021-11-24 ENCOUNTER — Other Ambulatory Visit: Payer: Self-pay | Admitting: Internal Medicine

## 2021-11-24 ENCOUNTER — Telehealth: Payer: Self-pay

## 2021-11-24 DIAGNOSIS — A539 Syphilis, unspecified: Secondary | ICD-10-CM

## 2021-11-24 LAB — FLUORESCENT TREPONEMAL AB(FTA)-IGG-BLD: Fluorescent Treponemal ABS: NONREACTIVE

## 2021-11-24 LAB — RPR TITER: RPR Titer: 1:2 {titer} — ABNORMAL HIGH

## 2021-11-24 LAB — HIV-1 RNA QUANT-NO REFLEX-BLD
HIV 1 RNA Quant: NOT DETECTED Copies/mL
HIV-1 RNA Quant, Log: NOT DETECTED Log cps/mL

## 2021-11-24 LAB — RPR: RPR Ser Ql: REACTIVE — AB

## 2021-11-24 NOTE — Telephone Encounter (Signed)
Spoke with patient, relayed that syphilis testing was inconclusive. He accepts repeat lab appointment tomorrow 1/10. Advised him to refrain from any sexual activity until we obtain results from repeat testing. Patient verbalized understanding and has no further questions.   Beryle Flock, RN

## 2021-11-24 NOTE — Progress Notes (Signed)
Positive syphilis, low titer Query if related to his extensive rash, although rather odd -- would expect higher number. His initial rpr screen was negative though during rash presence  Of note, his fta was not reflexed on repeat testing although rpr was positive. And also possible autoimmune phenomenom? With low titer   -repeat testing both rpr and fta. If both positive will give pcn benzathine 2.4 mil units once

## 2021-11-24 NOTE — Telephone Encounter (Signed)
-----   Message from Raymondo Band, MD sent at 11/24/2021 10:01 AM EST ----- Hi team. Please see my documentation/thought regading this patient rpr testing. I am not too sure this is a real positive test. Let's have him come back and repeat testing, this time with a confirmatory fta as well. If positive on repeat testing will treat him.  In the mean time no sexual encounter. Thank you

## 2021-11-25 ENCOUNTER — Other Ambulatory Visit: Payer: Self-pay

## 2021-11-25 ENCOUNTER — Other Ambulatory Visit: Payer: Medicaid Other

## 2021-11-25 DIAGNOSIS — R21 Rash and other nonspecific skin eruption: Secondary | ICD-10-CM

## 2021-11-25 DIAGNOSIS — Z113 Encounter for screening for infections with a predominantly sexual mode of transmission: Secondary | ICD-10-CM | POA: Diagnosis not present

## 2021-11-28 LAB — RPR: RPR Ser Ql: NONREACTIVE

## 2021-11-28 LAB — T.PALLIDUM AB, TOTAL: T pallidum Antibodies (TP-PA): NONREACTIVE

## 2022-04-21 ENCOUNTER — Encounter: Payer: Self-pay | Admitting: *Deleted

## 2022-05-24 ENCOUNTER — Emergency Department (HOSPITAL_BASED_OUTPATIENT_CLINIC_OR_DEPARTMENT_OTHER)
Admission: EM | Admit: 2022-05-24 | Discharge: 2022-05-24 | Disposition: A | Discharge status: Home / Self Care | Payer: Medicaid Other | Attending: Emergency Medicine | Admitting: Emergency Medicine

## 2022-05-24 ENCOUNTER — Emergency Department (HOSPITAL_BASED_OUTPATIENT_CLINIC_OR_DEPARTMENT_OTHER): Payer: Medicaid Other | Admitting: Radiology

## 2022-05-24 ENCOUNTER — Other Ambulatory Visit: Payer: Self-pay

## 2022-05-24 ENCOUNTER — Encounter (HOSPITAL_BASED_OUTPATIENT_CLINIC_OR_DEPARTMENT_OTHER): Payer: Self-pay | Admitting: Emergency Medicine

## 2022-05-24 DIAGNOSIS — R072 Precordial pain: Secondary | ICD-10-CM | POA: Diagnosis not present

## 2022-05-24 DIAGNOSIS — F17290 Nicotine dependence, other tobacco product, uncomplicated: Secondary | ICD-10-CM | POA: Diagnosis not present

## 2022-05-24 DIAGNOSIS — R0789 Other chest pain: Secondary | ICD-10-CM | POA: Diagnosis not present

## 2022-05-24 NOTE — ED Provider Notes (Signed)
[facility] EMERGENCY DEPT Provider Note   CSN: [id number] Arrival date & time: 05/24/22  4098     History  Chief Complaint  Patient presents with   Chest Pain    Dale Moyer is a 21 y.o. male.  Patient as above with significant medical history as below, including seasonal allergies who presents to the ED with complaint of chest pain.  Patient reports yesterday he exerted himself more than typical, he went on a long bike ride, he went swimming and he also did a large amount of yard work.  He began having chest pain last night, described as midsternal, tightness, pulling sensation.  All across his chest.  Worsened with torso twisting or raising his arms or deep inspiration.  No significant dyspnea.  Symptoms have greatly improved since the onset.  Pain is very mild at this time.  No history of cardiac abnormalities in the past.  No family history of significant cardiac abnormalities, no first-degree relatives with MI at an early age.  He personally has no cardiac problems that he is aware of.  No recent medication or diet changes.  No stimulant use.  No illicit drug use.  No Tobacco use     Past Medical History:  Diagnosis Date   Seasonal allergies     Past Surgical History:  Procedure Laterality Date   APPENDECTOMY       The history is provided by the patient and a parent. No language interpreter was used.  Chest Pain Associated symptoms: no abdominal pain, no cough, no dysphagia, no fever, no headache, no nausea, no palpitations, no shortness of breath and no vomiting        Home Medications Prior to Admission medications   Medication Sig Start Date End Date Taking? Authorizing Provider  cetirizine (ZYRTEC) 10 MG tablet Take 1 tablet (10 mg total) by mouth daily. 01/31/21   Shirlean Mylar, MD  diphenhydrAMINE (BENADRYL) 25 MG tablet Take 1 tablet (25 mg total) by mouth every 6 (six) hours as needed. This medication can cause drowsiness. 09/30/21   Rhys Martini, PA-C  ibuprofen (ADVIL) 200 MG tablet Take 2 tablets (400 mg total) by mouth every 6 (six) hours as needed for mild pain or moderate pain. Patient not taking: Reported on 10/03/2021 12/28/19   Garnette Gunner, MD      Allergies    Patient has no known allergies.    Review of Systems   Review of Systems  Constitutional:  Negative for chills and fever.  HENT:  Negative for facial swelling and trouble swallowing.   Eyes:  Negative for photophobia and visual disturbance.  Respiratory:  Negative for cough and shortness of breath.   Cardiovascular:  Positive for chest pain. Negative for palpitations.  Gastrointestinal:  Negative for abdominal pain, nausea and vomiting.  Endocrine: Negative for polydipsia and polyuria.  Genitourinary:  Negative for difficulty urinating and hematuria.  Musculoskeletal:  Negative for gait problem and joint swelling.  Skin:  Negative for pallor and rash.  Neurological:  Negative for syncope and headaches.  Psychiatric/Behavioral:  Negative for agitation and confusion.     Physical Exam Updated Vital Signs BP 124/62 (BP Location: Right Arm)   Pulse 62   Temp 98.3 F (36.8 C)   Resp 16   SpO2 100%  Physical Exam Vitals and nursing note reviewed.  Constitutional:      General: He is not in acute distress.    Appearance: He is well-developed. He is not ill-appearing, toxic-appearing  or diaphoretic.  HENT:     Head: Normocephalic and atraumatic.     Right Ear: External ear normal.     Left Ear: External ear normal.     Mouth/Throat:     Mouth: Mucous membranes are moist.  Eyes:     General: No scleral icterus. Cardiovascular:     Rate and Rhythm: Normal rate and regular rhythm.     Pulses: Normal pulses.     Heart sounds: Normal heart sounds. No murmur heard.    No systolic murmur is present.     No diastolic murmur is present.     No S3 or S4 sounds.  Pulmonary:     Effort: Pulmonary effort is normal. No respiratory distress.      Breath sounds: Normal breath sounds.  Chest:       Comments: Mild TTP Abdominal:     General: Abdomen is flat.     Palpations: Abdomen is soft.     Tenderness: There is no abdominal tenderness.  Musculoskeletal:        General: Normal range of motion.     Cervical back: Normal range of motion.     Right lower leg: No edema.     Left lower leg: No edema.  Skin:    General: Skin is warm and dry.     Capillary Refill: Capillary refill takes less than 2 seconds.  Neurological:     Mental Status: He is alert and oriented to person, place, and time.     GCS: GCS eye subscore is 4. GCS verbal subscore is 5. GCS motor subscore is 6.  Psychiatric:        Mood and Affect: Mood normal.        Behavior: Behavior normal.     ED Results / Procedures / Treatments   Labs (all labs ordered are listed, but only abnormal results are displayed) Labs Reviewed - No data to display  EKG EKG Interpretation  Date/Time:  Sunday May 24 2022 08:49:16 EDT Ventricular Rate:  66 PR Interval:  146 QRS Duration: 84 QT Interval:  384 QTC Calculation: 402 R Axis:   75 Text Interpretation: Normal sinus rhythm ST elevation, consider early repolarization, pericarditis, or injury Nonspecific ST abnormality Abnormal ECG No previous ECGs available no stemi Confirmed by Tanda Rockers ([id number]) on 05/24/2022 11:32:02 AM  Radiology DG Chest 2 View  Result Date: 05/24/2022 CLINICAL DATA:  Chest pain. EXAM: CHEST - 2 VIEW COMPARISON:  October 11, 2009. FINDINGS: The heart size and mediastinal contours are within normal limits. Both lungs are clear. No visible pleural effusions or pneumothorax. No acute osseous abnormality. IMPRESSION: No active cardiopulmonary disease. Electronically Signed   By: Feliberto Harts M.D.   On: 05/24/2022 11:43    Procedures Procedures    Medications Ordered in ED Medications - No data to display  ED Course/ Medical Decision Making/ A&P                           Medical Decision  Making Amount and/or Complexity of Data Reviewed Radiology: ordered.    CC: cp  This patient presents to the Emergency Department for the above complaint. This involves an extensive number of treatment options and is a complaint that carries with it a high risk of complications and morbidity. Vital signs were reviewed. Serious etiologies considered.  Differential includes all life-threatening causes for chest pain. This includes but is not exclusive to acute coronary syndrome,  aortic dissection, pulmonary embolism, cardiac tamponade, community-acquired pneumonia, pericarditis, musculoskeletal chest wall pain, etc.   Record review:  Previous records obtained and reviewed prior office visits, prior ED visits, prior labs and imaging  Additional history obtained from father at bedside  Medical and surgical history as noted above.   Work up as above, notable for:  Labs & imaging results that were available during my care of the patient were visualized by me and considered in my medical decision making.  Physical exam as above.   I ordered imaging studies which included chest x-ray. I visualized the imaging, interpreted images, and I agree with radiologist interpretation. No acute process, no large ptx of effusion.   Cardiac monitoring reviewed and interpreted personally which shows NSR ECG with early repolarization, no evidence of acute ischemia   Management: N/a  ED Course:     Reassessment:  Symptoms have resolved.  Patient is breathing comfortably ambient air.  HDS.  Admission was considered.     The patient's chest pain is not suggestive of pulmonary embolus, cardiac ischemia, aortic dissection, pericarditis, myocarditis, pulmonary embolism, pneumothorax, pneumonia, Zoster, or esophageal perforation, or other serious etiology.  Historically not abrupt in onset, tearing or ripping, pulses symmetric. EKG nonspecific for ischemia/infarction. No dysrhythmias, brugada, WPW,  prolonged QT noted.   CXR reviewed and WNL, ECG wnl.  Given the extremely low risk of these diagnoses further testing and evaluation for these possibilities does not appear to be indicated at this time. Patient in no distress and overall condition improved here in the ED. Detailed discussions were had with the patient regarding current findings, and need for close f/u with PCP or on call doctor. The patient has been instructed to return immediately if the symptoms worsen in any way for re-evaluation. Patient verbalized understanding and is in agreement with current care plan. All questions answered prior to discharge.            Social determinants of health include -  Social History   Socioeconomic History   Marital status: Single    Spouse name: Not on file   Number of children: Not on file   Years of education: Not on file   Highest education level: Not on file  Occupational History   Not on file  Tobacco Use   Smoking status: Every Day    Types: E-cigarettes   Smokeless tobacco: Never  Substance and Sexual Activity   Alcohol use: Never   Drug use: Yes    Frequency: 7.0 times per week    Types: Marijuana   Sexual activity: Yes    Birth control/protection: None  Other Topics Concern   Not on file  Social History Narrative   Not on file   Social Determinants of Health   Financial Resource Strain: Not on file  Food Insecurity: Not on file  Transportation Needs: Not on file  Physical Activity: Not on file  Stress: Not on file  Social Connections: Not on file  Intimate Partner Violence: Not on file      This chart was dictated using voice recognition software.  Despite best efforts to proofread,  errors can occur which can change the documentation meaning.         Final Clinical Impression(s) / ED Diagnoses Final diagnoses:  Atypical chest pain    Rx / DC Orders ED Discharge Orders     None         Sloan Leiter, DO 05/24/22 1215

## 2022-05-24 NOTE — ED Triage Notes (Signed)
Pt admits to push mowing 3 hours yesterday,then went on a bike ride, and then went swimming. He states he is not that athletic.

## 2022-05-24 NOTE — Discharge Instructions (Addendum)
Return to the Emergency Department if you have unusual chest pain, pressure, or discomfort, shortness of breath, nausea, vomiting, burping, heartburn, tingling upper body parts, sweating, cold, clammy skin, or racing heartbeat. Call 911 if you think you are having a heart attack. Take all cardiac medications as prescribed - notify your doctor if you have any side effects. Follow cardiac diet - avoid fatty & fried foods, don't eat too much red meat, eat lots of fruits & vegetables, and dairy products should be low fat. Please lose weight if you are overweight. Become more active with walking, gardening, or any other activity that gets you to moving.

## 2022-05-24 NOTE — ED Notes (Signed)
RN provided AVS using Teachback Method. Patient verbalizes understanding of Discharge Instructions. Opportunity for Questioning and Answers were provided by RN. Patient Discharged from ED ambulatory to Home with Family. ? ?

## 2022-05-24 NOTE — ED Triage Notes (Signed)
Pt noticed last night pain in central chest with breathing.

## 2023-02-02 DIAGNOSIS — H5213 Myopia, bilateral: Secondary | ICD-10-CM | POA: Diagnosis not present

## 2023-03-12 ENCOUNTER — Other Ambulatory Visit: Payer: Self-pay

## 2023-03-12 DIAGNOSIS — S4992XA Unspecified injury of left shoulder and upper arm, initial encounter: Secondary | ICD-10-CM | POA: Diagnosis present

## 2023-03-12 DIAGNOSIS — S52122A Displaced fracture of head of left radius, initial encounter for closed fracture: Secondary | ICD-10-CM | POA: Insufficient documentation

## 2023-03-12 DIAGNOSIS — S52125A Nondisplaced fracture of head of left radius, initial encounter for closed fracture: Secondary | ICD-10-CM | POA: Diagnosis not present

## 2023-03-12 NOTE — ED Triage Notes (Signed)
[age] fell backwards while skateboarding. Caught self with left arm- upper arm and elbow.  Initially had full ROM after. Now reports shoulder pain with reduced ROM. Pulses intact, sensation intact, weakness noted left grip. No head trauma. No other injuries noted.

## 2023-03-13 ENCOUNTER — Emergency Department (HOSPITAL_BASED_OUTPATIENT_CLINIC_OR_DEPARTMENT_OTHER): Payer: BLUE CROSS/BLUE SHIELD | Admitting: Radiology

## 2023-03-13 ENCOUNTER — Emergency Department (HOSPITAL_BASED_OUTPATIENT_CLINIC_OR_DEPARTMENT_OTHER)
Admission: EM | Admit: 2023-03-13 | Discharge: 2023-03-13 | Disposition: A | Discharge status: Home / Self Care | Payer: BLUE CROSS/BLUE SHIELD | Attending: Emergency Medicine | Admitting: Emergency Medicine

## 2023-03-13 DIAGNOSIS — S52122A Displaced fracture of head of left radius, initial encounter for closed fracture: Secondary | ICD-10-CM | POA: Diagnosis not present

## 2023-03-13 DIAGNOSIS — S52125A Nondisplaced fracture of head of left radius, initial encounter for closed fracture: Secondary | ICD-10-CM

## 2023-03-13 MED ORDER — ACETAMINOPHEN 325 MG PO TABS
650.0000 mg | ORAL_TABLET | Freq: Once | ORAL | Status: AC
  Administered 2023-03-13: 650 mg via ORAL
  Filled 2023-03-13: qty 2

## 2023-03-13 NOTE — ED Provider Notes (Signed)
[place] EMERGENCY DEPARTMENT AT [facility] Provider Note   CSN: [id number] Arrival date & time: 03/12/23  2336     History  Chief Complaint  Patient presents with   Shoulder Injury    Dale Moyer is a 22 y.o. male.  Patient is a 22 year old male with no significant past medical history.  He presents today with complaints of a left arm injury.  He fell off a skateboard and onto his arm and injuring his left elbow.  He denies any weakness or numbness.  Pain is worse with movement.  No alleviating factors.  The history is provided by the patient.       Home Medications Prior to Admission medications   Medication Sig Start Date End Date Taking? Authorizing Provider  cetirizine (ZYRTEC) 10 MG tablet Take 1 tablet (10 mg total) by mouth daily. 01/31/21   Shirlean Mylar, MD  diphenhydrAMINE (BENADRYL) 25 MG tablet Take 1 tablet (25 mg total) by mouth every 6 (six) hours as needed. This medication can cause drowsiness. 09/30/21   Rhys Martini, PA-C  ibuprofen (ADVIL) 200 MG tablet Take 2 tablets (400 mg total) by mouth every 6 (six) hours as needed for mild pain or moderate pain. Patient not taking: Reported on 10/03/2021 12/28/19   Garnette Gunner, MD      Allergies    Patient has no known allergies.    Review of Systems   Review of Systems  All other systems reviewed and are negative.   Physical Exam Updated Vital Signs BP 123/64   Pulse 89   Temp 98.2 F (36.8 C)   Resp 18   SpO2 100%  Physical Exam Vitals and nursing note reviewed.  Constitutional:      Appearance: Normal appearance.  Pulmonary:     Effort: Pulmonary effort is normal.  Musculoskeletal:     Comments: The left arm is grossly normal in appearance.  There is tenderness over the radial head/elbow.  Ulnar and radial pulses are easily palpable and motor and sensation are intact throughout the entire hand.  Skin:    General: Skin is warm and dry.  Neurological:     Mental Status: He  is alert.     ED Results / Procedures / Treatments   Labs (all labs ordered are listed, but only abnormal results are displayed) Labs Reviewed - No data to display  EKG None  Radiology DG Shoulder Left  Result Date: 03/13/2023 CLINICAL DATA:  Recent fall while skateboarding with known left radial fracture and shoulder pain, initial encounter EXAM: LEFT SHOULDER - 2+ VIEW COMPARISON:  None Available. FINDINGS: There is no evidence of fracture or dislocation. There is no evidence of arthropathy or other focal bone abnormality. Soft tissues are unremarkable. IMPRESSION: No acute abnormality noted. Electronically Signed   By: Alcide Clever M.D.   On: 03/13/2023 00:58   DG Elbow Complete Left  Result Date: 03/13/2023 CLINICAL DATA:  Recent fall while skateboarding with elbow pain, initial encounter EXAM: LEFT ELBOW - COMPLETE 3+ VIEW COMPARISON:  None Available. FINDINGS: Elevation of the anterior and posterior fat pads is seen consistent with joint effusion. There is a mildly displaced fracture of the radial head identified. No other fracture is seen. IMPRESSION: Radial head fracture with evidence of joint effusion. Electronically Signed   By: Alcide Clever M.D.   On: 03/13/2023 00:56    Procedures Procedures    Medications Ordered in ED Medications - No data to display  ED Course/ Medical  Decision Making/ A&P  X-rays show a radial head fracture and joint effusion.  Patient will be placed in a long-arm splint, arm sling, and will be instructed to follow-up with orthopedics in the next few days.  Final Clinical Impression(s) / ED Diagnoses Final diagnoses:  None    Rx / DC Orders ED Discharge Orders     None         Geoffery Lyons, MD 03/13/23 [phone]

## 2023-03-13 NOTE — Discharge Instructions (Signed)
Wear arm sling and splint as applied until followed up by orthopedics.  Ice for 20 minutes every 2 hours while awake for the next 2 days.  Keep your arm elevated is much as possible.  Take ibuprofen 600 mg every 6 hours as needed for pain.  You are to follow-up with orthopedics in the next 3 to 4 days.  The contact information for Dr. Magnus Ivan has been provided in this discharge summary for you to call and make these arrangements.

## 2023-03-16 ENCOUNTER — Encounter: Payer: Self-pay | Admitting: Physician Assistant

## 2023-03-16 ENCOUNTER — Ambulatory Visit (INDEPENDENT_AMBULATORY_CARE_PROVIDER_SITE_OTHER): Payer: BLUE CROSS/BLUE SHIELD | Admitting: Physician Assistant

## 2023-03-16 DIAGNOSIS — S52125A Nondisplaced fracture of head of left radius, initial encounter for closed fracture: Secondary | ICD-10-CM | POA: Diagnosis not present

## 2023-03-16 DIAGNOSIS — S52122A Displaced fracture of head of left radius, initial encounter for closed fracture: Secondary | ICD-10-CM | POA: Insufficient documentation

## 2023-03-16 NOTE — Progress Notes (Signed)
Office Visit Note   Patient: Dale Moyer           Date of Birth: 2000/11/23           MRN: [id number] Visit Date: 03/16/2023              Requested by: Erick Alley, DO [address] [place],  [place] [address] PCP: Erick Alley, DO   Assessment & Plan: Visit Diagnoses:  1. Closed nondisplaced fracture of head of left radius, initial encounter     Plan: Patient is a 22 year old healthy gentleman who is 3 days status post falling onto his left elbow while skateboarding.  He was seen and evaluated in the emergency room.  He was diagnosed with a minimally displaced radial head fracture and was placed in a splint and sling.  Denies any previous history to his elbow.  Presents today.  X-rays consistent with nondisplaced fracture of the radial head.  Examination is reassuring.  I will place him back in his sling but he understands he is not to use this arm and is to remain in the sling but may come out for washing and bathing.  Will follow-up next week with me for new x-rays.  Would then begin range of motion as long as the x-rays are satisfactory.  If there is significant displacement would refer him to one of our surgeons  Follow-Up Instructions: Return in about 1 week (around 03/23/2023).   Orders:  No orders of the defined types were placed in this encounter.  No orders of the defined types were placed in this encounter.     Procedures: No procedures performed   Clinical Data: No additional findings.   Subjective: Chief Complaint  Patient presents with   Left Wrist - Fracture    HPI Patient is a pleasant 22 year old gentleman with a history of falling onto his left elbow.  Describes the pain is moderate.  Was diagnosed with a fracture in his elbow in the emergency room.  No previous history of injury.  He has had a clavicle fracture and before again secondary to skateboarding he is otherwise healthy Review of Systems  All other systems reviewed and are  negative.    Objective: Vital Signs: There were no vitals taken for this visit.  Physical Exam Constitutional:      Appearance: Normal appearance.  HENT:     Head: Normocephalic.  Pulmonary:     Effort: Pulmonary effort is normal.  Skin:    General: Skin is warm and dry.  Neurological:     Mental Status: He is alert.     Ortho Exam Examination of his arm he has no ecchymosis no erythema skin is intact.  He is tender over the radial head which radiates down his arm.  He has brisk capillary refill in all his fingers all of his finger easily able to oppose radial pulses intact. He does have good flexion has some mild stiffness with extension but mostly limited by pain can supinate and pronate his hand again limited by pain more than anything Specialty Comments:  No specialty comments available.  Imaging: No results found.   PMFS History: Patient Active Problem List   Diagnosis Date Noted   Fracture of radial head, left, closed 03/16/2023   Rash 10/03/2021   Generalized body aches 11/13/2020   External hemorrhoids without complication 11/13/2020   Depression, major, single episode, moderate (HCC) 12/28/2019   Sprain of right rotator cuff capsule 12/28/2019   Seasonal allergies 09/07/2017  Past Medical History:  Diagnosis Date   Seasonal allergies     Family History  Problem Relation Age of Onset   Hypertension Mother    Hypertension Father    Diabetes Maternal Grandmother    Cancer - Cervical Maternal Grandmother    Diabetes Maternal Grandfather    Heart attack Maternal Grandfather    Heart disease Maternal Uncle    Breast cancer Maternal Aunt     Past Surgical History:  Procedure Laterality Date   APPENDECTOMY     Social History   Occupational History   Not on file  Tobacco Use   Smoking status: Every Day    Types: E-cigarettes   Smokeless tobacco: Never  Substance and Sexual Activity   Alcohol use: Never   Drug use: Yes    Frequency: 7.0 times  per week    Types: Marijuana   Sexual activity: Yes    Birth control/protection: None

## 2023-03-23 ENCOUNTER — Encounter: Payer: Self-pay | Admitting: Physician Assistant

## 2023-03-23 ENCOUNTER — Ambulatory Visit (INDEPENDENT_AMBULATORY_CARE_PROVIDER_SITE_OTHER): Payer: Medicaid Other | Admitting: Physician Assistant

## 2023-03-23 ENCOUNTER — Other Ambulatory Visit (INDEPENDENT_AMBULATORY_CARE_PROVIDER_SITE_OTHER): Payer: BLUE CROSS/BLUE SHIELD

## 2023-03-23 DIAGNOSIS — S52125A Nondisplaced fracture of head of left radius, initial encounter for closed fracture: Secondary | ICD-10-CM

## 2023-03-23 NOTE — Progress Notes (Signed)
Office Visit Note   Patient: Dale Moyer           Date of Birth: 18-Apr-2001           MRN: [id number] Visit Date: 03/23/2023              Requested by: Erick Alley, DO [address] [place],  [place] [address] PCP: Erick Alley, DO  Chief Complaint  Patient presents with   Left Elbow - Follow-up      HPI: Patient is a pleasant 22 year old gentleman who is almost 2 weeks status post left nondisplaced radial head fracture.  He is not wearing his splint today.  He does say he feels a little bit better.  He has been working on gentle range of motion.  Assessment & Plan: Visit Diagnoses:  1. Closed nondisplaced fracture of head of left radius, initial encounter     Plan: He is doing very well.  Cautioned him that he should still use the splint sling especially in high risk situations do not want him to lift any weights with the left elbow is motion is actually pretty good with flexion supination and pronation he is missing a few degrees of extension but will work on stretching his arm to achieve this goal.  Again he should not be lifting with this arm.  Should be using his sling we will follow-up at the 4-week mark for new x-rays  Follow-Up Instructions: No follow-ups on file.   Ortho Exam  Patient is alert, oriented, no adenopathy, well-dressed, normal affect, normal respiratory effort. Examination of his left elbow he has no bruising he still has some tenderness over the fracture.  He has good active pronation and supination and flexion.  He is missing about 5 to 7 degrees of full extension.  He is neurovascular intact compartments are soft  Imaging: XR Elbow Complete Left (3+View)  Result Date: 03/23/2023 Three-view radiographs of his left elbow were obtained today well maintain alignment.  He has a nondisplaced fracture of the radial head has not moved since previous x-rays  No images are attached to the encounter.  Labs: No results found for: "HGBA1C", "ESRSEDRATE", "CRP",  "LABURIC", "REPTSTATUS", "GRAMSTAIN", "CULT", "LABORGA"   Lab Results  Component Value Date   ALBUMIN 5.1 10/02/2021   ALBUMIN 4.5 09/09/2009    No results found for: "MG" No results found for: "VD25OH"  No results found for: "PREALBUMIN"    Latest Ref Rng & Units 10/02/2021    4:25 PM 09/10/2009    1:30 PM 09/09/2009   12:33 PM  CBC EXTENDED  WBC 3.4 - 10.8 x10E3/uL 6.9  7.6  13.8   RBC 4.14 - 5.80 x10E6/uL 4.73  3.36  4.15   Hemoglobin 13.0 - 17.7 g/dL 19.1  9.9 DELTA CHECK NOTED  12.3   HCT 37.5 - 51.0 % 42.5  29.0  35.3   Platelets 150 - 450 x10E3/uL 243  248  302   NEUT# 1.4 - 7.0 x10E3/uL 4.9  5.8  12.6   Lymph# 0.7 - 3.1 x10E3/uL 1.3  1.3  0.6      There is no height or weight on file to calculate BMI.  Orders:  Orders Placed This Encounter  Procedures   XR Elbow Complete Left (3+View)   No orders of the defined types were placed in this encounter.    Procedures: No procedures performed  Clinical Data: No additional findings.  ROS:  All other systems negative, except as noted in the HPI.  Review of Systems  Objective: Vital Signs: There were no vitals taken for this visit.  Specialty Comments:  No specialty comments available.  PMFS History: Patient Active Problem List   Diagnosis Date Noted   Fracture of radial head, left, closed 03/16/2023   Rash 10/03/2021   Generalized body aches 11/13/2020   External hemorrhoids without complication 11/13/2020   Depression, major, single episode, moderate (HCC) 12/28/2019   Sprain of right rotator cuff capsule 12/28/2019   Seasonal allergies 09/07/2017   Past Medical History:  Diagnosis Date   Seasonal allergies     Family History  Problem Relation Age of Onset   Hypertension Mother    Hypertension Father    Diabetes Maternal Grandmother    Cancer - Cervical Maternal Grandmother    Diabetes Maternal Grandfather    Heart attack Maternal Grandfather    Heart disease Maternal Uncle    Breast  cancer Maternal Aunt     Past Surgical History:  Procedure Laterality Date   APPENDECTOMY     Social History   Occupational History   Not on file  Tobacco Use   Smoking status: Every Day    Types: E-cigarettes   Smokeless tobacco: Never  Substance and Sexual Activity   Alcohol use: Never   Drug use: Yes    Frequency: 7.0 times per week    Types: Marijuana   Sexual activity: Yes    Birth control/protection: None

## 2023-04-06 ENCOUNTER — Encounter: Payer: Self-pay | Admitting: Physician Assistant

## 2023-04-06 ENCOUNTER — Other Ambulatory Visit (INDEPENDENT_AMBULATORY_CARE_PROVIDER_SITE_OTHER): Payer: BLUE CROSS/BLUE SHIELD

## 2023-04-06 ENCOUNTER — Ambulatory Visit (INDEPENDENT_AMBULATORY_CARE_PROVIDER_SITE_OTHER): Payer: BLUE CROSS/BLUE SHIELD | Admitting: Physician Assistant

## 2023-04-06 DIAGNOSIS — S52125A Nondisplaced fracture of head of left radius, initial encounter for closed fracture: Secondary | ICD-10-CM

## 2023-04-06 NOTE — Progress Notes (Signed)
Office Visit Note   Patient: Dale Moyer           Date of Birth: 03-03-2001           MRN: [id number] Visit Date: 04/06/2023              Requested by: Erick Alley, DO [address] [place],  [place] [address] PCP: Erick Alley, DO  Chief Complaint  Patient presents with   Left Elbow - Follow-up      HPI: Patient is a pleasant 22 year old gentleman who is now a month status post nondisplaced radial head fracture of the left elbow.  He is doing well today denies any pain just occasional soreness.  Describes his pain is minimal to mild.  Assessment & Plan: Visit Diagnoses:  1. Closed nondisplaced fracture of head of left radius, initial encounter     Plan: X-rays demonstrate no displacement and difficulty even discern the fracture at this point.  He has good extension and flexion supination and pronation actively.  Only be mild tenderness to deep palpation.  Discussed with him to still refrain from doing any activities that may put him at risk for falling on the elbow for the next couple weeks.  He would like to get back to weight training and I am fine if he does light weights but should not progress to heavy weights and should listen to any painful symptoms for the next few weeks may follow-up as needed  Follow-Up Instructions: No follow-ups on file.   Ortho Exam  Patient is alert, oriented, no adenopathy, well-dressed, normal affect, normal respiratory effort. Examination of his left arm he has full extension though may be a degree less than his other arm which goes to hyperextension.  He is neurovascularly intact.  Good grip strength.  He has good active supination and pronation and full flexion.  Minimally tender to deep palpation over the fracture site no ecchymosis  Imaging: XR Elbow Complete Left (3+View)  Result Date: 04/06/2023 Radiographs of his left elbow were obtained today.  Well-maintained alignment no evidence of dislocation or subluxation radial head is intact  no evidence of displacement of the fracture which appears healed and difficult to discern on current x-rays  No images are attached to the encounter.  Labs: No results found for: "HGBA1C", "ESRSEDRATE", "CRP", "LABURIC", "REPTSTATUS", "GRAMSTAIN", "CULT", "LABORGA"   Lab Results  Component Value Date   ALBUMIN 5.1 10/02/2021   ALBUMIN 4.5 09/09/2009    No results found for: "MG" No results found for: "VD25OH"  No results found for: "PREALBUMIN"    Latest Ref Rng & Units 10/02/2021    4:25 PM 09/10/2009    1:30 PM 09/09/2009   12:33 PM  CBC EXTENDED  WBC 3.4 - 10.8 x10E3/uL 6.9  7.6  13.8   RBC 4.14 - 5.80 x10E6/uL 4.73  3.36  4.15   Hemoglobin 13.0 - 17.7 g/dL 19.1  9.9 DELTA CHECK NOTED  12.3   HCT 37.5 - 51.0 % 42.5  29.0  35.3   Platelets 150 - 450 x10E3/uL 243  248  302   NEUT# 1.4 - 7.0 x10E3/uL 4.9  5.8  12.6   Lymph# 0.7 - 3.1 x10E3/uL 1.3  1.3  0.6      There is no height or weight on file to calculate BMI.  Orders:  Orders Placed This Encounter  Procedures   XR Elbow Complete Left (3+View)   No orders of the defined types were placed in this encounter.  Procedures: No procedures performed  Clinical Data: No additional findings.  ROS:  All other systems negative, except as noted in the HPI. Review of Systems  Objective: Vital Signs: There were no vitals taken for this visit.  Specialty Comments:  No specialty comments available.  PMFS History: Patient Active Problem List   Diagnosis Date Noted   Fracture of radial head, left, closed 03/16/2023   Rash 10/03/2021   Generalized body aches 11/13/2020   External hemorrhoids without complication 11/13/2020   Depression, major, single episode, moderate (HCC) 12/28/2019   Sprain of right rotator cuff capsule 12/28/2019   Seasonal allergies 09/07/2017   Past Medical History:  Diagnosis Date   Seasonal allergies     Family History  Problem Relation Age of Onset   Hypertension Mother     Hypertension Father    Diabetes Maternal Grandmother    Cancer - Cervical Maternal Grandmother    Diabetes Maternal Grandfather    Heart attack Maternal Grandfather    Heart disease Maternal Uncle    Breast cancer Maternal Aunt     Past Surgical History:  Procedure Laterality Date   APPENDECTOMY     Social History   Occupational History   Not on file  Tobacco Use   Smoking status: Every Day    Types: E-cigarettes   Smokeless tobacco: Never  Substance and Sexual Activity   Alcohol use: Never   Drug use: Yes    Frequency: 7.0 times per week    Types: Marijuana   Sexual activity: Yes    Birth control/protection: None

## 2023-06-11 ENCOUNTER — Ambulatory Visit: Payer: BLUE CROSS/BLUE SHIELD

## 2023-06-11 ENCOUNTER — Ambulatory Visit (INDEPENDENT_AMBULATORY_CARE_PROVIDER_SITE_OTHER): Payer: BLUE CROSS/BLUE SHIELD | Admitting: Family Medicine

## 2023-06-11 ENCOUNTER — Other Ambulatory Visit: Payer: Self-pay

## 2023-06-11 VITALS — BP 106/69 | HR 101 | Ht 67.0 in | Wt 116.6 lb

## 2023-06-11 DIAGNOSIS — Z113 Encounter for screening for infections with a predominantly sexual mode of transmission: Secondary | ICD-10-CM

## 2023-06-11 DIAGNOSIS — R590 Localized enlarged lymph nodes: Secondary | ICD-10-CM | POA: Diagnosis not present

## 2023-06-11 DIAGNOSIS — R229 Localized swelling, mass and lump, unspecified: Secondary | ICD-10-CM | POA: Insufficient documentation

## 2023-06-11 NOTE — Assessment & Plan Note (Addendum)
Discrete, mobile, non-tender area on R inguinal fold most consistent with lymph node vs cyst. More likely lymph node given location. Denies any other associated symptoms but sexually active with new partners, need to rule out infectious source. Possibly irritated due to surrounding MSK inflammation from R hip strain. -HIV, RPR today -Provided urine sample and instructed to bring back Monday morning with first void urine (only 20mL) to test for G/C. Urinated just before visit and could not obtain a sample today.

## 2023-06-11 NOTE — Progress Notes (Signed)
    SUBJECTIVE:   CHIEF COMPLAINT / HPI:   Bump on R inguinal area Reports he has a bump in the R groin fold that has been present present for the past couple weeks. Reports it is not painful or itchy but can be mildly sore to the touch. Denies fever or N/V. Denies dysuria or penile discharge. Reports new sexual partner in the last 2 weeks. Has not have STI testing recently. Reports he did strain muscles around his R hip recently.  PERTINENT  PMH / PSH: Allergies, depression  OBJECTIVE:   BP 106/69   Pulse (!) 101   Ht 5\' 7"  (1.702 m)   Wt 116 lb 9.6 oz (52.9 kg)   BMI 18.26 kg/m    General: Alert, no apparent distress, well groomed HEENT: Normocephalic, atraumatic, moist mucus membranes, neck supple Respiratory: Normal respiratory effort GI: Non-distended Skin: Grape-sized discrete, mobile, non-tender nodule midway on R inguinal fold. No surrounding erythema or edema. No discrete head or discharge noted. Exam chaperoned by Dayshia, CMA. Psych: Appropriate mood and affect  ASSESSMENT/PLAN:   Skin nodule Discrete, mobile, non-tender area on R inguinal fold most consistent with lymph node vs cyst. More likely lymph node given location. Denies any other associated symptoms but sexually active with new partners, need to rule out infectious source. Possibly irritated due to surrounding MSK inflammation from R hip strain. -HIV, RPR today -Provided urine sample and instructed to bring back Monday morning with first void urine (only 20mL) to test for G/C. Urinated just before visit and could not obtain a sample today.   Dr. Elberta Fortis, DO [facility]

## 2023-06-11 NOTE — Patient Instructions (Addendum)
It was wonderful to see you today! Thank you for choosing [facility].   Please bring ALL of your medications with you to every visit.   Today we talked about:  I think the spot in your groin fold is a lymph node. I would like to check you for sexually transmitted infection. Please bring the urine container back on Monday. It needs to be your first urination (have not peed within 1 hour before sample) and bring back as soon after the sample as you can. The area could also be a cyst but it is unlikely to worsen and would just remain the same. Please follow up if it is getting bigger or more painful over time.  Please follow up for persistent symptoms  If you haven't already, sign up for My Chart to have easy access to your labs results, and communication with your primary care physician.   We are checking some labs today. If they are abnormal, I will call you. If they are normal, I will send you a MyChart message (if it is active) or a letter in the mail. If you do not hear about your labs in the next 2 weeks, please call the office.  Call the clinic at [phone] if your symptoms worsen or you have any concerns.  Please be sure to schedule follow up at the front desk before you leave today.   Elberta Fortis, DO Family Medicine

## 2023-06-14 ENCOUNTER — Other Ambulatory Visit (HOSPITAL_COMMUNITY)
Admission: RE | Admit: 2023-06-14 | Discharge: 2023-06-14 | Disposition: A | Discharge status: Home / Self Care | Payer: BLUE CROSS/BLUE SHIELD | Source: Ambulatory Visit | Attending: Family Medicine | Admitting: Family Medicine

## 2023-06-14 DIAGNOSIS — R590 Localized enlarged lymph nodes: Secondary | ICD-10-CM | POA: Diagnosis present

## 2023-06-14 DIAGNOSIS — Z113 Encounter for screening for infections with a predominantly sexual mode of transmission: Secondary | ICD-10-CM | POA: Diagnosis present

## 2023-06-19 ENCOUNTER — Encounter (HOSPITAL_BASED_OUTPATIENT_CLINIC_OR_DEPARTMENT_OTHER): Payer: Self-pay

## 2023-06-19 ENCOUNTER — Emergency Department (HOSPITAL_BASED_OUTPATIENT_CLINIC_OR_DEPARTMENT_OTHER): Payer: BLUE CROSS/BLUE SHIELD | Admitting: Radiology

## 2023-06-19 ENCOUNTER — Emergency Department (HOSPITAL_BASED_OUTPATIENT_CLINIC_OR_DEPARTMENT_OTHER)
Admission: EM | Admit: 2023-06-19 | Discharge: 2023-06-19 | Disposition: A | Discharge status: Home / Self Care | Payer: BLUE CROSS/BLUE SHIELD | Attending: Emergency Medicine | Admitting: Emergency Medicine

## 2023-06-19 ENCOUNTER — Other Ambulatory Visit: Payer: Self-pay

## 2023-06-19 DIAGNOSIS — R059 Cough, unspecified: Secondary | ICD-10-CM | POA: Diagnosis not present

## 2023-06-19 DIAGNOSIS — R091 Pleurisy: Secondary | ICD-10-CM | POA: Diagnosis not present

## 2023-06-19 DIAGNOSIS — R0789 Other chest pain: Secondary | ICD-10-CM | POA: Diagnosis not present

## 2023-06-19 DIAGNOSIS — R079 Chest pain, unspecified: Secondary | ICD-10-CM | POA: Diagnosis present

## 2023-06-19 LAB — BASIC METABOLIC PANEL
Anion gap: 8 (ref 5–15)
BUN: 11 mg/dL (ref 6–20)
CO2: 27 mmol/L (ref 22–32)
Calcium: 9.3 mg/dL (ref 8.9–10.3)
Chloride: 99 mmol/L (ref 98–111)
Creatinine, Ser: 0.73 mg/dL (ref 0.61–1.24)
GFR, Estimated: >60 mL/min (ref >60)
Glucose, Bld: 99 mg/dL (ref 70–99)
Potassium: 4 mmol/L (ref 3.5–5.1)
Sodium: 134 mmol/L — ABNORMAL LOW (ref 135–145)

## 2023-06-19 LAB — CBC
HCT: 39.4 % (ref 39.0–52.0)
Hemoglobin: 13.2 g/dL (ref 13.0–17.0)
MCH: 29.8 pg (ref 26.0–34.0)
MCHC: 33.5 g/dL (ref 30.0–36.0)
MCV: 88.9 fL (ref 80.0–100.0)
Platelets: 283 10*3/uL (ref 150–400)
RBC: 4.43 MIL/uL (ref 4.22–5.81)
RDW: 11.9 % (ref 11.5–15.5)
WBC: 13.2 10*3/uL — ABNORMAL HIGH (ref 4.0–10.5)
nRBC: 0 % (ref 0.0–0.2)

## 2023-06-19 LAB — TROPONIN I (HIGH SENSITIVITY): Troponin I (High Sensitivity): 3 ng/L (ref <18)

## 2023-06-19 MED ORDER — IBUPROFEN 800 MG PO TABS
800.0000 mg | ORAL_TABLET | Freq: Once | ORAL | Status: AC
  Administered 2023-06-19: 800 mg via ORAL
  Filled 2023-06-19: qty 1

## 2023-06-19 MED ORDER — ALBUTEROL SULFATE HFA 108 (90 BASE) MCG/ACT IN AERS
1.0000 | INHALATION_SPRAY | Freq: Once | RESPIRATORY_TRACT | Status: AC
  Administered 2023-06-19: 2 via RESPIRATORY_TRACT
  Filled 2023-06-19: qty 6.7

## 2023-06-19 MED ORDER — AEROCHAMBER PLUS FLO-VU MISC
1.0000 | Freq: Once | Status: AC
  Administered 2023-06-19: 1
  Filled 2023-06-19: qty 1

## 2023-06-19 NOTE — Discharge Instructions (Addendum)
Please use Tylenol or ibuprofen for pain.  You may use 600 mg ibuprofen every 6 hours or 1000 mg of Tylenol every 6 hours.  You may choose to alternate between the 2.  This would be most effective.  Not to exceed 4 g of Tylenol within 24 hours.  Not to exceed 3200 mg ibuprofen 24 hours.  

## 2023-06-19 NOTE — ED Notes (Signed)
RT assessed pt for SOB. Pt w/hx of vaping and smoking marijuana. Pt states he always has a cough but this feels different and hurts when he coughs. Pt respiratory status is stable on RA w/no distress noted at this time.

## 2023-06-19 NOTE — ED Triage Notes (Signed)
Pt to ED c/o chest pain that started this morning, worse with cough and deep breathing. No known sick contacts. Denies SHOB. No s/s of acute distress noted in triage. Reports vapes and smokes THC

## 2023-06-19 NOTE — ED Provider Notes (Signed)
[place] EMERGENCY DEPARTMENT AT [facility] Provider Note   CSN: [id number] Arrival date & time: 06/19/23  2145     History  Chief Complaint  Patient presents with   Chest Pain    Dale Moyer is a 22 y.o. male with past medical history significant for depression, anxiety, who presents with chest pain, worse with breathing since this morning. Was not active when chest pain began. Reports somewhat worse with exertion. Endorses daily vaping and marijuana use. Reports worse with coughing. Has not tried anything for pain.   Chest Pain      Home Medications Prior to Admission medications   Medication Sig Start Date End Date Taking? Authorizing Provider  cetirizine (ZYRTEC) 10 MG tablet Take 1 tablet (10 mg total) by mouth daily. 01/31/21   Shirlean Mylar, MD  diphenhydrAMINE (BENADRYL) 25 MG tablet Take 1 tablet (25 mg total) by mouth every 6 (six) hours as needed. This medication can cause drowsiness. 09/30/21   Rhys Martini, PA-C  ibuprofen (ADVIL) 200 MG tablet Take 2 tablets (400 mg total) by mouth every 6 (six) hours as needed for mild pain or moderate pain. Patient not taking: Reported on 10/03/2021 12/28/19   Garnette Gunner, MD      Allergies    Patient has no known allergies.    Review of Systems   Review of Systems  Cardiovascular:  Positive for chest pain.  All other systems reviewed and are negative.   Physical Exam Updated Vital Signs BP (!) 123/59   Pulse 86   Temp 97.9 F (36.6 C) (Oral)   Resp (!) 21   Ht 5\' 7"  (1.702 m)   Wt 52.6 kg   SpO2 100%   BMI 18.17 kg/m  Physical Exam Vitals and nursing note reviewed.  Constitutional:      General: He is not in acute distress.    Appearance: Normal appearance.  HENT:     Head: Normocephalic and atraumatic.  Eyes:     General:        Right eye: No discharge.        Left eye: No discharge.  Cardiovascular:     Rate and Rhythm: Normal rate and regular rhythm.     Heart sounds: No  murmur heard.    No friction rub. No gallop.  Pulmonary:     Effort: Pulmonary effort is normal.     Breath sounds: Normal breath sounds.     Comments: Mild inspiratory wheeze Abdominal:     General: Bowel sounds are normal.     Palpations: Abdomen is soft.  Skin:    General: Skin is warm and dry.     Capillary Refill: Capillary refill takes less than 2 seconds.  Neurological:     Mental Status: He is alert and oriented to person, place, and time.  Psychiatric:        Mood and Affect: Mood normal.        Behavior: Behavior normal.     ED Results / Procedures / Treatments   Labs (all labs ordered are listed, but only abnormal results are displayed) Labs Reviewed  CBC - Abnormal; Notable for the following components:      Result Value   WBC 13.2 (*)    All other components within normal limits  BASIC METABOLIC PANEL - Abnormal; Notable for the following components:   Sodium 134 (*)    All other components within normal limits  TROPONIN I (HIGH SENSITIVITY)    EKG  None  Radiology DG Chest 2 View  Result Date: 06/19/2023 CLINICAL DATA:  Shortness of breath, chest pain EXAM: CHEST - 2 VIEW COMPARISON:  05/24/2022 FINDINGS: The heart size and mediastinal contours are within normal limits. Both lungs are clear. The visualized skeletal structures are unremarkable. IMPRESSION: Normal study. Electronically Signed   By: Charlett Nose M.D.   On: 06/19/2023 22:23    Procedures Procedures    Medications Ordered in ED Medications  albuterol (VENTOLIN HFA) 108 (90 Base) MCG/ACT inhaler 1-2 puff (2 puffs Inhalation Given 06/19/23 2233)  ibuprofen (ADVIL) tablet 800 mg (800 mg Oral Given 06/19/23 2240)  aerochamber plus with mask device 1 each (1 each Other Given 06/19/23 2233)    ED Course/ Medical Decision Making/ A&P                                 Medical Decision Making Amount and/or Complexity of Data Reviewed Labs: ordered. Radiology: ordered.  Risk Prescription drug  management.   This patient is a 22 y.o. male  who presents to the ED for concern of chest pain, shob.   Differential diagnoses prior to evaluation: The emergent differential diagnosis includes, but is not limited to,  ACS, AAS, PE, Mallory-Weiss, Boerhaave's, Pneumonia, acute bronchitis, asthma or COPD exacerbation, anxiety, MSK pain or traumatic injury to the chest, acid reflux versus other . This is not an exhaustive differential.   Past Medical History / Co-morbidities / Social History: Depression, anxiety  Additional history: Chart reviewed. Pertinent results include: reviewed labwork, imaging from previous ED visits  Physical Exam: Physical exam performed. The pertinent findings include: Mild inspiratory wheeze, stable vital signs. Somewhat anxious at baseline.  Lab Tests/Imaging studies: I personally interpreted labs/imaging and the pertinent results include: CBC with mild leukocytosis, BMP, troponin overall unremarkable, I independently interpreted plain film chest x-ray shows no evidence of acute intrathoracic abnormality. I agree with the radiologist interpretation.  Cardiac monitoring: EKG obtained and interpreted by myself and attending physician which shows: Normal sinus rhythm   Medications: I ordered medication including albuterol for mild aspiratory wheeze, ibuprofen for pain, on reassessment patient with improvement of chest pain somewhat, and wheezing improved..  I have reviewed the patients home medicines and have made adjustments as needed.   Disposition: After consideration of the diagnostic results and the patients response to treatment, I feel that patient is stable for discharge with no evidence of acute ACS, AAS, he is negative by [facility] criteria for PE, high clinical suspicion for pleurisy/chest pain related to coughing, versus vape associated lung injury.  Stable oxygen saturation on room air.  Counseled vaping and marijuana cessation.  Encouraged PCP follow-up.Marland Kitchen    emergency department workup does not suggest an emergent condition requiring admission or immediate intervention beyond what has been performed at this time. The plan is: as above. The patient is safe for discharge and has been instructed to return immediately for worsening symptoms, change in symptoms or any other concerns.  Final Clinical Impression(s) / ED Diagnoses Final diagnoses:  Pleurisy  Atypical chest pain    Rx / DC Orders ED Discharge Orders     None         [place] 06/19/23 2307    Benjiman Core, MD 06/20/23 1441

## 2023-06-19 NOTE — ED Notes (Signed)
RT educated pt on proper use of MDI w/spacer. Pt able to perform w/out difficulty. Pt performed teach back and verbalized understanding of administration procedure and dosing instructions.   06/19/23 2233  Aerosol Therapy Tx  $ Hand Held Nebulizer  1  Medications Albuterol  Delivery Source Air  Delivery Device MDI (with spacer)  Pre-Treatment Pulse 81  Pre-Treatment Respirations 14  Treatment Tolerance Tolerated well  Treatment Given 1  RT Breath Sounds  Bilateral Breath Sounds Clear;Diminished  R Upper  Breath Sounds Clear  L Upper Breath Sounds Clear  R Lower Breath Sounds Clear;Diminished  L Lower Breath Sounds Clear;Diminished  Oxygen Therapy/Pulse Ox  O2 Device Room Air  O2 Therapy Room air

## 2023-06-28 ENCOUNTER — Ambulatory Visit: Payer: BLUE CROSS/BLUE SHIELD | Admitting: Family Medicine

## 2023-06-28 NOTE — Progress Notes (Deleted)
    SUBJECTIVE:   CHIEF COMPLAINT / HPI:   ***  PERTINENT  PMH / PSH: ***  OBJECTIVE:   There were no vitals taken for this visit.  ***  ASSESSMENT/PLAN:   No problem-specific Assessment & Plan notes found for this encounter.     Ivery Quale, MD [facility]

## 2023-08-23 ENCOUNTER — Institutional Professional Consult (permissible substitution): Payer: BLUE CROSS/BLUE SHIELD | Admitting: Pulmonary Disease

## 2023-08-24 ENCOUNTER — Encounter: Payer: Self-pay | Admitting: Pulmonary Disease

## 2023-11-13 ENCOUNTER — Ambulatory Visit (HOSPITAL_COMMUNITY)
Admission: RE | Admit: 2023-11-13 | Discharge: 2023-11-13 | Disposition: A | Discharge status: Home / Self Care | Payer: BLUE CROSS/BLUE SHIELD | Source: Ambulatory Visit | Attending: Internal Medicine | Admitting: Internal Medicine

## 2023-11-13 ENCOUNTER — Encounter (HOSPITAL_COMMUNITY): Payer: Self-pay

## 2023-11-13 VITALS — BP 144/81 | HR 84 | Temp 97.9°F | Resp 16 | Ht 67.0 in | Wt 115.0 lb

## 2023-11-13 DIAGNOSIS — Z113 Encounter for screening for infections with a predominantly sexual mode of transmission: Secondary | ICD-10-CM | POA: Insufficient documentation

## 2023-11-13 NOTE — ED Provider Notes (Signed)
[facility]    CSN: [id number] Arrival date & time: 11/13/23  1629      History   Chief Complaint Chief Complaint  Patient presents with   SEXUALLY TRANSMITTED DISEASE    HPI Dale Moyer is a 22 y.o. male.   22 year old male who presents to urgent care with request for STI screening including syphilis and HIV.  The patient reports that he is having no symptoms, denies discharge, penile pain, lower abdominal, dysuria, hematuria.  He reports it has been a while since he has been tested and he has had a new partner since then.  He also relates that about 2 days ago he woke up with a very dry throat and noticed a little bit of blood when he coughed up.  This only happened 1 time and has not happened since.  He did smoke a lot of weed and cigarettes the night before.  He denies any cough, shortness of breath, chest pain, fevers or chills.     Past Medical History:  Diagnosis Date   Seasonal allergies     Patient Active Problem List   Diagnosis Date Noted   Skin nodule 06/11/2023   Fracture of radial head, left, closed 03/16/2023   Generalized body aches 11/13/2020   External hemorrhoids without complication 11/13/2020   Depression, major, single episode, moderate (HCC) 12/28/2019   Sprain of right rotator cuff capsule 12/28/2019   Seasonal allergies 09/07/2017    Past Surgical History:  Procedure Laterality Date   APPENDECTOMY         Home Medications    Prior to Admission medications   Medication Sig Start Date End Date Taking? Authorizing Provider  cetirizine (ZYRTEC) 10 MG tablet Take 1 tablet (10 mg total) by mouth daily. 01/31/21   Shirlean Mylar, MD  diphenhydrAMINE (BENADRYL) 25 MG tablet Take 1 tablet (25 mg total) by mouth every 6 (six) hours as needed. This medication can cause drowsiness. 09/30/21   Rhys Martini, PA-C  ibuprofen (ADVIL) 200 MG tablet Take 2 tablets (400 mg total) by mouth every 6 (six) hours as needed for mild pain or  moderate pain. Patient not taking: Reported on 10/03/2021 12/28/19   Garnette Gunner, MD    Family History Family History  Problem Relation Age of Onset   Hypertension Mother    Hypertension Father    Diabetes Maternal Grandmother    Cancer - Cervical Maternal Grandmother    Diabetes Maternal Grandfather    Heart attack Maternal Grandfather    Breast cancer Maternal Aunt    Heart disease Maternal Uncle     Social History Social History   Tobacco Use   Smoking status: Every Day    Types: E-cigarettes   Smokeless tobacco: Never  Vaping Use   Vaping status: Every Day  Substance Use Topics   Alcohol use: Never   Drug use: Yes    Frequency: 7.0 times per week    Types: Marijuana     Allergies   Patient has no known allergies.   Review of Systems Review of Systems  Constitutional:  Negative for chills and fever.  HENT:  Negative for ear pain and sore throat.   Eyes:  Negative for pain and visual disturbance.  Respiratory:  Negative for cough and shortness of breath.   Cardiovascular:  Negative for chest pain and palpitations.  Gastrointestinal:  Negative for abdominal pain and vomiting.  Genitourinary:  Negative for dysuria and hematuria.  Musculoskeletal:  Negative for arthralgias and  back pain.  Skin:  Negative for color change and rash.  Neurological:  Negative for seizures and syncope.  All other systems reviewed and are negative.    Physical Exam Triage Vital Signs ED Triage Vitals  Encounter Vitals Group     BP 11/13/23 1649 (!) 144/81     Systolic BP Percentile --      Diastolic BP Percentile --      Pulse Rate 11/13/23 1649 84     Resp 11/13/23 1649 16     Temp 11/13/23 1649 97.9 F (36.6 C)     Temp Source 11/13/23 1649 Oral     SpO2 11/13/23 1649 98 %     Weight 11/13/23 1650 115 lb (52.2 kg)     Height 11/13/23 1650 5\' 7"  (1.702 m)     Head Circumference --      Peak Flow --      Pain Score 11/13/23 1650 0     Pain Loc --      Pain  Education --      Exclude from Growth Chart --    No data found.  Updated Vital Signs BP (!) 144/81 (BP Location: Right Arm)   Pulse 84   Temp 97.9 F (36.6 C) (Oral)   Resp 16   Ht 5\' 7"  (1.702 m)   Wt 115 lb (52.2 kg)   SpO2 98%   BMI 18.01 kg/m   Visual Acuity Right Eye Distance:   Left Eye Distance:   Bilateral Distance:    Right Eye Near:   Left Eye Near:    Bilateral Near:     Physical Exam Vitals and nursing note reviewed.  Constitutional:      General: He is not in acute distress.    Appearance: He is well-developed.  HENT:     Head: Normocephalic and atraumatic.     Mouth/Throat:     Pharynx: Posterior oropharyngeal erythema (Mild) present.  Eyes:     Conjunctiva/sclera: Conjunctivae normal.  Cardiovascular:     Rate and Rhythm: Normal rate and regular rhythm.     Heart sounds: No murmur heard. Pulmonary:     Effort: Pulmonary effort is normal. No respiratory distress.     Breath sounds: Normal breath sounds.  Abdominal:     Palpations: Abdomen is soft.     Tenderness: There is no abdominal tenderness.  Musculoskeletal:        General: No swelling.     Cervical back: Neck supple.  Skin:    General: Skin is warm and dry.     Capillary Refill: Capillary refill takes less than 2 seconds.  Neurological:     Mental Status: He is alert.  Psychiatric:        Mood and Affect: Mood normal.      UC Treatments / Results  Labs (all labs ordered are listed, but only abnormal results are displayed) Labs Reviewed  RPR  HIV ANTIBODY (ROUTINE TESTING W REFLEX)  CYTOLOGY, (ORAL, ANAL, URETHRAL) ANCILLARY ONLY    EKG   Radiology No results found.  Procedures Procedures (including critical care time)  Medications Ordered in UC Medications - No data to display  Initial Impression / Assessment and Plan / UC Course  I have reviewed the triage vital signs and the nursing notes.  Pertinent labs & imaging results that were available during my care of  the patient were reviewed by me and considered in my medical decision making (see chart for details).  Screening examination for STI   Screening swab and blood work done today and results will be available in 24-48 hours. We will contact you if we need to arrange additional treatment based on your testing. Negative results will be on your MyChart account. Abstain from sex until you receive your final results.  Use a condom for sexual encounters. If you have any worsening or changing symptoms including abnormal discharge, pelvic pain, abdominal pain, fever, nausea, or vomiting, then you should be reevaluated.    Reassured patient that if this episode of bloody sputum has only happened once and has not happened again that it is likely secondary to smoking along with a dry throat.  Advised patient that he should monitor the symptom and if he starts to see if becoming more recurrent that he should return for further evaluation.  Final Clinical Impressions(s) / UC Diagnoses   Final diagnoses:  Screening examination for STI     Discharge Instructions      Screening swab and blood work done today and results will be available in 24-48 hours. We will contact you if we need to arrange additional treatment based on your testing. Negative results will be on your MyChart account. Abstain from sex until you receive your final results.  Use a condom for sexual encounters. If you have any worsening or changing symptoms including abnormal discharge, pelvic pain, abdominal pain, fever, nausea, or vomiting, then you should be reevaluated.      ED Prescriptions   None    PDMP not reviewed this encounter.   Landis Martins, [place] 11/13/23 [phone]

## 2023-11-13 NOTE — ED Triage Notes (Addendum)
Pt presents to urgent care to be tested for STD's. Pt denies symptoms at this time. Pt is requesting swab and blood work be completed. Pt also states "about two days ago, I spit up blood when I woke up. I did vape & smoke weed the night before. I am not sure if that is correlated but I was worried about it, thought I would just bring it up while being seen today." Pt states this was the only occurrence. Pt currently denies pain & denies taking medications for his symptoms reported today.

## 2023-11-13 NOTE — Discharge Instructions (Addendum)
Screening swab and blood work done today and results will be available in 24-48 hours. We will contact you if we need to arrange additional treatment based on your testing. Negative results will be on your MyChart account. Abstain from sex until you receive your final results.  Use a condom for sexual encounters. If you have any worsening or changing symptoms including abnormal discharge, pelvic pain, abdominal pain, fever, nausea, or vomiting, then you should be reevaluated.

## 2023-11-14 LAB — RPR: RPR Ser Ql: NONREACTIVE

## 2023-11-15 LAB — HIV ANTIBODY (ROUTINE TESTING W REFLEX): HIV Screen 4th Generation wRfx: NONREACTIVE

## 2023-11-15 LAB — CYTOLOGY, (ORAL, ANAL, URETHRAL) ANCILLARY ONLY
Chlamydia: NEGATIVE
Comment: NEGATIVE
Comment: NEGATIVE
Comment: NORMAL
Neisseria Gonorrhea: NEGATIVE
Trichomonas: NEGATIVE

## 2023-11-19 DIAGNOSIS — L700 Acne vulgaris: Secondary | ICD-10-CM | POA: Diagnosis not present

## 2023-11-23 ENCOUNTER — Ambulatory Visit (INDEPENDENT_AMBULATORY_CARE_PROVIDER_SITE_OTHER): Payer: BLUE CROSS/BLUE SHIELD | Admitting: Student

## 2023-11-23 ENCOUNTER — Encounter: Payer: Self-pay | Admitting: Student

## 2023-11-23 VITALS — BP 92/62 | HR 67 | Ht 67.0 in | Wt 121.8 lb

## 2023-11-23 DIAGNOSIS — L700 Acne vulgaris: Secondary | ICD-10-CM | POA: Diagnosis not present

## 2023-11-23 MED ORDER — ADAPALENE-BENZOYL PEROXIDE 0.1-2.5 % EX GEL: 1.0000 "application " | Freq: Every day | CUTANEOUS | 0 refills | Status: DC

## 2023-11-23 MED ORDER — DOXYCYCLINE HYCLATE 100 MG PO TABS: 100.0000 mg | ORAL_TABLET | Freq: Every day | ORAL | 0 refills | Status: DC

## 2023-11-23 NOTE — Progress Notes (Signed)
    SUBJECTIVE:   CHIEF COMPLAINT / HPI: Dermatology  Treid salicylic acid 1 weeks ago maybe purged skin  Now using dial antibacterial soap This week tried salicylic acid x 1 and irritated ago Cerave moisturizer Getting worse in last few months Over face and back  PERTINENT  PMH / PSH: reviewed  OBJECTIVE:   BP 92/62   Pulse 67   Ht 5' 7 (1.702 m)   Wt 121 lb 12.8 oz (55.2 kg)   SpO2 99%   BMI 19.08 kg/m   General: Well appearing, NAD, awake, alert, responsive to questions Head: Normocephalic, papulopustular lesions over cheek and forehead, no mucosal lesions Respiratory:chest rises symmetrically,  no increased work of breathing Chest/Back: Papulopustular lesions over back and chest     ASSESSMENT/PLAN:   Assessment & Plan Acne vulgaris Severe acne over face and body, never tried any medications before - Doxycycline  100 daily x 30 days - Adapalene -Benzoyl Peroxide  0.1-2.5 % gel - Stop salicyclic acid for now, gentle cleansers and moisturizers discussed - Ambulatory referral to Dermatology for consideration of Accutane if not improved   Wendel Lesch, MD [facility]

## 2023-11-23 NOTE — Patient Instructions (Signed)
 It was great to see you! Thank you for allowing me to participate in your care!   Our plans for today:  -I am referring you to dermatology for evaluation -In the meantime I am going to start you on a couple medications including an antibiotics to take orally for 30 days -I am also sending in adapalene  and benzyl peroxide combination gel.  Cause irritation/purging/dryness.  I would recommend starting with doing a pea-sized amount 1 time a week and gradually increasing until you are up to daily administration of this at nighttime.  If having a lot of irritation I would recommend doing the CeraVe a moisturizer first and then putting on the gel after that and another layer of moisturizer after that (sandwich method) -The doxycycline  may make you more prone to sun burning so please wear sunscreen when going outside -1 month follow up for skin and also physical with labs  Take care and seek immediate care sooner if you develop any concerns.  Wendel Lesch, MD

## 2023-11-29 DIAGNOSIS — L700 Acne vulgaris: Secondary | ICD-10-CM | POA: Diagnosis not present

## 2023-11-30 DIAGNOSIS — Z79899 Other long term (current) drug therapy: Secondary | ICD-10-CM | POA: Diagnosis not present

## 2023-11-30 DIAGNOSIS — L700 Acne vulgaris: Secondary | ICD-10-CM | POA: Diagnosis not present

## 2023-12-14 ENCOUNTER — Other Ambulatory Visit: Payer: BLUE CROSS/BLUE SHIELD

## 2023-12-14 ENCOUNTER — Other Ambulatory Visit: Payer: Self-pay

## 2023-12-14 ENCOUNTER — Encounter: Payer: Self-pay | Admitting: Student

## 2023-12-14 ENCOUNTER — Telehealth: Payer: Self-pay

## 2023-12-14 ENCOUNTER — Ambulatory Visit (INDEPENDENT_AMBULATORY_CARE_PROVIDER_SITE_OTHER): Payer: BLUE CROSS/BLUE SHIELD | Admitting: Student

## 2023-12-14 VITALS — BP 110/65 | HR 67 | Temp 98.2°F | Wt 117.2 lb

## 2023-12-14 DIAGNOSIS — J029 Acute pharyngitis, unspecified: Secondary | ICD-10-CM | POA: Diagnosis not present

## 2023-12-14 DIAGNOSIS — R52 Pain, unspecified: Secondary | ICD-10-CM

## 2023-12-14 LAB — POCT RAPID STREP A (OFFICE): Rapid Strep A Screen: NEGATIVE

## 2023-12-14 LAB — POC SOFIA 2 FLU + SARS ANTIGEN FIA
Influenza A, POC: NEGATIVE
Influenza B, POC: NEGATIVE
SARS Coronavirus 2 Ag: NEGATIVE

## 2023-12-14 NOTE — Assessment & Plan Note (Addendum)
4 days of mild neck and knee pain with dry/sore throat and mildly itchy macular rash on arms and lower back, diarrhea, and eye pain with movement (now resolved). Strep A PCR neg Flu/COVID neg  Symptoms could be side effects of starting Accutane 12 days ago vs viral infection.  Patient states he was told by dermatologist that unwanted side effects usually resolve within the first week. Other differentials considered including fibromyalgia or chronic fatigue syndrome (would not expect these to be so acute), PMR (would not expect in someone his age), meningitis (he is afebrile with no nuchal rigidity), Pseudotumor cerebri (can be side effect of Accutane but he has no changes in vision, no headache, no nausea or vomiting). -CBC w/ diff as Accutane can cause leukopenia, neutropenia, thrombocytopenia -CMP as Accutane can cause elevated LFTs -HIV, unlikely to be positive as he was negative in December 2024 but we will recheck -mono test  -If tests are unrevealing, patient advised to follow-up with derm if symptoms persist and return if dermatologist does not think they are related to Accutane

## 2023-12-14 NOTE — Patient Instructions (Addendum)
You tested negative for flu A, COVID and strep today. It is possible your symptoms are related to a different viral infection vs related to Accutane. If the symptoms continue past a week, I recommend discussing this further with your dermatologist. For now, continue to use heating pads, apply lotion and Aquaphor, and take ibuprofen 400 to 600 mg every 6 hours as needed for pain. If you develop new or worsening symptoms, please return.

## 2023-12-14 NOTE — Telephone Encounter (Signed)
Patient calls nurse line reporting he missed a call from PCP.   He reports she stated she had a "quick question" for patient.   Advised will forward to PCP.

## 2023-12-14 NOTE — Addendum Note (Signed)
Addended by: Howard Pouch on: 12/14/2023 03:18 PM   Modules accepted: Orders

## 2023-12-14 NOTE — Progress Notes (Signed)
    SUBJECTIVE:   CHIEF COMPLAINT / HPI:   Pt complains of 4 days of dry and sore throat, neck pain with rotation and extension, eye pressure (like someone is pushing them in, much improved today), pain with eye movement (though not having this now), bilateral knee pain worse with standing or when feeling cold - not worse with walking and better with heating pad. Also with diarrhea once daily for 3 days. No productive cough, fever, headache, vision changes, nausea, vomiting or decreased PO intake. Also notes a mildly itchy red rash on arms which just started this morning and states lower back is itching a little.   Just started Acutaine 12 days ago.  He called his dermatologist to discuss the symptoms as they thought they could be a side effect.  He was told to go to his PCP for evaluation.   PERTINENT  PMH / PSH: Depression  OBJECTIVE:   BP 110/65 (BP Location: Right Arm, Patient Position: Sitting, Cuff Size: Normal)   Pulse 67   Temp 98.2 F (36.8 C)   Wt 117 lb 3.2 oz (53.2 kg)   SpO2 100%   BMI 18.36 kg/m    General: NAD, well-appearing, able to participate in exam HEENT: White sclera, clear conjunctiva, EOEMI without pain, PERRL, MMM, mild erythema of soft palate, oropharynx, and tonsils.  Anterior and posterior cervical lymphadenopathy present which is TTP Cardiac: RRR, no murmurs. Respiratory: CTAB, normal effort, No wheezes, rales or rhonchi Abdomen: Bowel sounds present, nontender, nondistended, soft Extremities: no edema or cyanosis. MSK: Good cervical ROM with mild pain with neck extension, no pain with rotation, no nuchal rigidity, no neck pain with palpation.  No erythema, edema of knees, no pain with palpation of knees, 5/5 muscle strength of BLEs Skin: warm and dry, mild macular erythematous rash on bilateral forearms and lower back.  See photo Neuro: alert, no obvious focal deficits Psych: Normal affect and mood       ASSESSMENT/PLAN:   body aches, rash and  sore throat 4 days of mild neck and knee pain with dry/sore throat and mildly itchy macular rash on arms and lower back, diarrhea, and eye pain with movement (now resolved). Strep A PCR neg Flu/COVID neg  Symptoms could be side effects of starting Accutane 12 days ago vs viral infection.  Patient states he was told by dermatologist that unwanted side effects usually resolve within the first week. Other differentials considered including fibromyalgia or chronic fatigue syndrome (would not expect these to be so acute), PMR (would not expect in someone his age), meningitis (he is afebrile with no nuchal rigidity), Pseudotumor cerebri (can be side effect of Accutane but he has no changes in vision, no headache, no nausea or vomiting). -CBC w/ diff as Accutane can cause leukopenia, neutropenia, thrombocytopenia -CMP as Accutane can cause elevated LFTs -HIV, unlikely to be positive as he was negative in December 2024 but we will recheck -mono test  -If tests are unrevealing, patient advised to follow-up with derm if symptoms persist and return if dermatologist does not think they are related to Accutane      Dr. Erick Alley, DO [facility]

## 2023-12-15 ENCOUNTER — Encounter: Payer: Self-pay | Admitting: Student

## 2023-12-15 ENCOUNTER — Telehealth: Payer: Self-pay | Admitting: Student

## 2023-12-15 DIAGNOSIS — R52 Pain, unspecified: Secondary | ICD-10-CM

## 2023-12-15 DIAGNOSIS — R748 Abnormal levels of other serum enzymes: Secondary | ICD-10-CM

## 2023-12-15 LAB — COMPREHENSIVE METABOLIC PANEL
ALT: 94 [IU]/L — ABNORMAL HIGH (ref 0–44)
AST: 365 [IU]/L — ABNORMAL HIGH (ref 0–40)
Albumin: 4.5 g/dL (ref 4.3–5.2)
Alkaline Phosphatase: 50 [IU]/L (ref 44–121)
BUN/Creatinine Ratio: 11 (ref 9–20)
BUN: 10 mg/dL (ref 6–20)
Bilirubin Total: 0.3 mg/dL (ref 0.0–1.2)
CO2: 24 mmol/L (ref 20–29)
Calcium: 9 mg/dL (ref 8.7–10.2)
Chloride: 99 mmol/L (ref 96–106)
Creatinine, Ser: 0.91 mg/dL (ref 0.76–1.27)
Globulin, Total: 3.4 g/dL (ref 1.5–4.5)
Glucose: 98 mg/dL (ref 70–99)
Potassium: 4.7 mmol/L (ref 3.5–5.2)
Sodium: 136 mmol/L (ref 134–144)
Total Protein: 7.9 g/dL (ref 6.0–8.5)
eGFR: 123 mL/min/{1.73_m2} (ref >59)

## 2023-12-15 LAB — CBC WITH DIFFERENTIAL/PLATELET
Basophils Absolute: 0 10*3/uL (ref 0.0–0.2)
Basos: 0 %
EOS (ABSOLUTE): 0 10*3/uL (ref 0.0–0.4)
Eos: 0 %
Hematocrit: 40.3 % (ref 37.5–51.0)
Hemoglobin: 13.9 g/dL (ref 13.0–17.7)
Immature Grans (Abs): 0 10*3/uL (ref 0.0–0.1)
Immature Granulocytes: 0 %
Lymphocytes Absolute: 1.2 10*3/uL (ref 0.7–3.1)
Lymphs: 36 %
MCH: 31.4 pg (ref 26.6–33.0)
MCHC: 34.5 g/dL (ref 31.5–35.7)
MCV: 91 fL (ref 79–97)
Monocytes Absolute: 0.2 10*3/uL (ref 0.1–0.9)
Monocytes: 5 %
Neutrophils Absolute: 2 10*3/uL (ref 1.4–7.0)
Neutrophils: 59 %
Platelets: 174 10*3/uL (ref 150–450)
RBC: 4.42 x10E6/uL (ref 4.14–5.80)
RDW: 12.2 % (ref 11.6–15.4)
WBC: 3.4 10*3/uL (ref 3.4–10.8)

## 2023-12-15 LAB — MONONUCLEOSIS SCREEN: Mono Screen: NEGATIVE

## 2023-12-15 LAB — HIV ANTIBODY (ROUTINE TESTING W REFLEX): HIV Screen 4th Generation wRfx: NONREACTIVE

## 2023-12-15 NOTE — Telephone Encounter (Signed)
Called patient discussed recent lab results showing elevated liver enzymes AST>ALT.only drinks alcohol on special occasions, not even weekly.  No excessive Tylenol use. States he is feeling a little bit better today, body aches including neck ache and knee aches have improved.  Still feeling a mild pressure at his eyes, no vision changes.  Does have a fever of 102.4 today. Discussed that liver enzymes could be elevated due to viral illness, could be hepatitis, could potentially be side effect of Accutane.  Advised to stop taking Accutane for now while liver enzymes are elevated.  Can also consider autoimmune disorder.  Father does have rheumatoid arthritis. Still waiting on monotest to return. Patient agrees to return tomorrow for lab visit. Will repeat CBC with differential, along with hepatitis panel, and hepatic panel to trend LFTs.

## 2023-12-16 ENCOUNTER — Other Ambulatory Visit: Payer: BLUE CROSS/BLUE SHIELD

## 2023-12-16 DIAGNOSIS — R52 Pain, unspecified: Secondary | ICD-10-CM

## 2023-12-16 DIAGNOSIS — R748 Abnormal levels of other serum enzymes: Secondary | ICD-10-CM

## 2023-12-16 NOTE — Addendum Note (Signed)
Addended by: Jennette Bill on: 12/16/2023 04:16 PM   Modules accepted: Orders

## 2023-12-17 ENCOUNTER — Telehealth: Payer: Self-pay | Admitting: Student

## 2023-12-17 ENCOUNTER — Emergency Department (HOSPITAL_COMMUNITY): Payer: Medicaid Other

## 2023-12-17 ENCOUNTER — Other Ambulatory Visit: Payer: Self-pay

## 2023-12-17 ENCOUNTER — Encounter (HOSPITAL_COMMUNITY): Payer: Self-pay

## 2023-12-17 ENCOUNTER — Inpatient Hospital Stay (HOSPITAL_COMMUNITY)
Admission: EM | Admit: 2023-12-17 | Discharge: 2023-12-20 | DRG: 558 | Disposition: A | Discharge status: Home / Self Care | Payer: Medicaid Other | Attending: Family Medicine | Admitting: Family Medicine

## 2023-12-17 DIAGNOSIS — M6282 Rhabdomyolysis: Principal | ICD-10-CM | POA: Diagnosis present

## 2023-12-17 DIAGNOSIS — M25561 Pain in right knee: Secondary | ICD-10-CM | POA: Diagnosis present

## 2023-12-17 DIAGNOSIS — K719 Toxic liver disease, unspecified: Secondary | ICD-10-CM | POA: Diagnosis not present

## 2023-12-17 DIAGNOSIS — R599 Enlarged lymph nodes, unspecified: Secondary | ICD-10-CM | POA: Diagnosis present

## 2023-12-17 DIAGNOSIS — B179 Acute viral hepatitis, unspecified: Secondary | ICD-10-CM | POA: Diagnosis not present

## 2023-12-17 DIAGNOSIS — R945 Abnormal results of liver function studies: Secondary | ICD-10-CM | POA: Diagnosis not present

## 2023-12-17 DIAGNOSIS — Z803 Family history of malignant neoplasm of breast: Secondary | ICD-10-CM

## 2023-12-17 DIAGNOSIS — R7989 Other specified abnormal findings of blood chemistry: Secondary | ICD-10-CM | POA: Diagnosis not present

## 2023-12-17 DIAGNOSIS — R7401 Elevation of levels of liver transaminase levels: Secondary | ICD-10-CM | POA: Diagnosis not present

## 2023-12-17 DIAGNOSIS — K759 Inflammatory liver disease, unspecified: Secondary | ICD-10-CM | POA: Diagnosis not present

## 2023-12-17 DIAGNOSIS — Z833 Family history of diabetes mellitus: Secondary | ICD-10-CM

## 2023-12-17 DIAGNOSIS — F17290 Nicotine dependence, other tobacco product, uncomplicated: Secondary | ICD-10-CM | POA: Diagnosis present

## 2023-12-17 DIAGNOSIS — Z91014 Allergy to mammalian meats: Secondary | ICD-10-CM

## 2023-12-17 DIAGNOSIS — R197 Diarrhea, unspecified: Secondary | ICD-10-CM | POA: Diagnosis present

## 2023-12-17 DIAGNOSIS — M25562 Pain in left knee: Secondary | ICD-10-CM | POA: Diagnosis present

## 2023-12-17 DIAGNOSIS — Z8249 Family history of ischemic heart disease and other diseases of the circulatory system: Secondary | ICD-10-CM

## 2023-12-17 DIAGNOSIS — Z79899 Other long term (current) drug therapy: Secondary | ICD-10-CM

## 2023-12-17 LAB — HEPATIC FUNCTION PANEL
ALT: 228 [IU]/L — ABNORMAL HIGH (ref 0–44)
AST: 901 [IU]/L (ref 0–40)
Albumin: 4.6 g/dL (ref 4.3–5.2)
Alkaline Phosphatase: 51 [IU]/L (ref 44–121)
Bilirubin Total: 0.5 mg/dL (ref 0.0–1.2)
Bilirubin, Direct: 0.2 mg/dL (ref 0.00–0.40)
Total Protein: 8.1 g/dL (ref 6.0–8.5)

## 2023-12-17 LAB — COMPREHENSIVE METABOLIC PANEL
ALT: 267 U/L — ABNORMAL HIGH (ref 0–44)
AST: 910 U/L — ABNORMAL HIGH (ref 15–41)
Albumin: 4 g/dL (ref 3.5–5.0)
Alkaline Phosphatase: 37 U/L — ABNORMAL LOW (ref 38–126)
Anion gap: 8 (ref 5–15)
BUN: 11 mg/dL (ref 6–20)
CO2: 26 mmol/L (ref 22–32)
Calcium: 9.1 mg/dL (ref 8.9–10.3)
Chloride: 99 mmol/L (ref 98–111)
Creatinine, Ser: 0.58 mg/dL — ABNORMAL LOW (ref 0.61–1.24)
GFR, Estimated: >60 mL/min (ref >60)
Glucose, Bld: 104 mg/dL — ABNORMAL HIGH (ref 70–99)
Potassium: 4.1 mmol/L (ref 3.5–5.1)
Sodium: 133 mmol/L — ABNORMAL LOW (ref 135–145)
Total Bilirubin: 0.9 mg/dL (ref 0.0–1.2)
Total Protein: 8.2 g/dL — ABNORMAL HIGH (ref 6.5–8.1)

## 2023-12-17 LAB — HEPATITIS PANEL, ACUTE
HCV Ab: NONREACTIVE
Hep A IgM: NONREACTIVE
Hep B C IgM: NONREACTIVE
Hepatitis B Surface Ag: NONREACTIVE

## 2023-12-17 LAB — CBC WITH DIFFERENTIAL/PLATELET
Basophils Absolute: 0 10*3/uL (ref 0.0–0.2)
Basos: 0 %
EOS (ABSOLUTE): 0 10*3/uL (ref 0.0–0.4)
Eos: 0 %
Hematocrit: 42.9 % (ref 37.5–51.0)
Hemoglobin: 14.4 g/dL (ref 13.0–17.7)
Immature Grans (Abs): 0 10*3/uL (ref 0.0–0.1)
Immature Granulocytes: 0 %
Lymphocytes Absolute: 2 10*3/uL (ref 0.7–3.1)
Lymphs: 43 %
MCH: 30.9 pg (ref 26.6–33.0)
MCHC: 33.6 g/dL (ref 31.5–35.7)
MCV: 92 fL (ref 79–97)
Monocytes Absolute: 0.4 10*3/uL (ref 0.1–0.9)
Monocytes: 9 %
Neutrophils Absolute: 2.2 10*3/uL (ref 1.4–7.0)
Neutrophils: 48 %
Platelets: 220 10*3/uL (ref 150–450)
RBC: 4.66 x10E6/uL (ref 4.14–5.80)
RDW: 12.3 % (ref 11.6–15.4)
WBC: 4.7 10*3/uL (ref 3.4–10.8)

## 2023-12-17 LAB — CBC
HCT: 40.1 % (ref 39.0–52.0)
Hemoglobin: 13.3 g/dL (ref 13.0–17.0)
MCH: 30.6 pg (ref 26.0–34.0)
MCHC: 33.2 g/dL (ref 30.0–36.0)
MCV: 92.2 fL (ref 80.0–100.0)
Platelets: 243 10*3/uL (ref 150–400)
RBC: 4.35 MIL/uL (ref 4.22–5.81)
RDW: 12.3 % (ref 11.5–15.5)
WBC: 4.7 10*3/uL (ref 4.0–10.5)
nRBC: 0 % (ref 0.0–0.2)

## 2023-12-17 LAB — PROTIME-INR
INR: 1.2 (ref 0.8–1.2)
Prothrombin Time: 15.5 s — ABNORMAL HIGH (ref 11.4–15.2)

## 2023-12-17 LAB — APTT: aPTT: 34 s (ref 24–36)

## 2023-12-17 LAB — IRON AND TIBC
Iron: 93 ug/dL (ref 45–182)
Saturation Ratios: 34 % (ref 17.9–39.5)
TIBC: 273 ug/dL (ref 250–450)
UIBC: 180 ug/dL

## 2023-12-17 LAB — FERRITIN: Ferritin: 507 ng/mL — ABNORMAL HIGH (ref 24–336)

## 2023-12-17 LAB — CK: Total CK: 27727 U/L — ABNORMAL HIGH (ref 49–397)

## 2023-12-17 LAB — ACETAMINOPHEN LEVEL: Acetaminophen (Tylenol), Serum: <10 ug/mL — ABNORMAL LOW (ref 10–30)

## 2023-12-17 LAB — LIPASE, BLOOD: Lipase: 29 U/L (ref 11–51)

## 2023-12-17 LAB — MONONUCLEOSIS SCREEN: Mono Screen: NEGATIVE

## 2023-12-17 LAB — LACTATE DEHYDROGENASE: LDH: 900 U/L — ABNORMAL HIGH (ref 98–192)

## 2023-12-17 LAB — ETHANOL: Alcohol, Ethyl (B): <10 mg/dL (ref <10)

## 2023-12-17 MED ORDER — ONDANSETRON HCL 4 MG PO TABS: 4.0000 mg | ORAL_TABLET | Freq: Four times a day (QID) | ORAL | Status: DC | PRN

## 2023-12-17 MED ORDER — TRAZODONE HCL 50 MG PO TABS: 25.0000 mg | ORAL_TABLET | Freq: Every evening | ORAL | Status: DC | PRN

## 2023-12-17 MED ORDER — ONDANSETRON HCL 4 MG/2ML IJ SOLN: 4.0000 mg | Freq: Four times a day (QID) | INTRAMUSCULAR | Status: DC | PRN

## 2023-12-17 MED ORDER — LACTATED RINGERS IV BOLUS: 1000.0000 mL | Freq: Once | INTRAVENOUS | Status: DC

## 2023-12-17 MED ORDER — ALBUTEROL SULFATE (2.5 MG/3ML) 0.083% IN NEBU: 2.5000 mg | INHALATION_SOLUTION | RESPIRATORY_TRACT | Status: DC | PRN

## 2023-12-17 MED ORDER — ENOXAPARIN SODIUM 40 MG/0.4ML IJ SOSY
40.0000 mg | PREFILLED_SYRINGE | INTRAMUSCULAR | Status: DC
  Administered 2023-12-18 – 2023-12-19 (×2): 40 mg via SUBCUTANEOUS
  Filled 2023-12-17 (×2): qty 0.4

## 2023-12-17 MED ORDER — IBUPROFEN 200 MG PO TABS: 400.0000 mg | ORAL_TABLET | Freq: Four times a day (QID) | ORAL | Status: DC | PRN

## 2023-12-17 NOTE — Consult Note (Signed)
[facility] Gastroenterology Consult  Referring Provider: No ref. provider found Primary Care Physician:  Erick Alley, DO Primary Gastroenterologist: Gentry Fitz  Reason for Consultation: Elevated liver enzymes  SUBJECTIVE:   HPI: Dale Moyer is a 23 y.o. male with past medical history significant for seasonal allergies.  Was started on Accutane therapy 12/02/2023 for acne, few weeks into therapy he began to have diarrhea, fevers/chills, myalgias.  He presented to his primary care physician on 12/14/2023 and had blood work completed including CBC with differential, CMP, HIV testing, mononucleosis testing, strep a testing, influenza and COVID testing.  Results showed AST/ALT 365/94, alkaline phosphatase 50, total bilirubin 0.3.  Was recommended to discontinue Accutane and have repeat labs drawn.  Hepatic function panel completed 12/16/2023 showed AST/ALT 901/228, alkaline phosphatase 51, total bilirubin 0.5.  He was subsequently recommended to present to the emergency department for further evaluation and management.  Labs on presentation to the emergency department showed AST/ALT 910/267, alkaline phosphatase 37, total bilirubin 0.9, lipase 29, INR 1.2, ethanol level less than 10, acetaminophen level less than 10. Right upper quadrant abdominal ultrasound showed common bile duct 1 mm, no gallstones or wall thickening, no focal liver lesion, portal vein patent.  Apart from Accutane therapy (which he discontinued on 12/15/2023), patient noted that he was using a creatine supplement over the past few weeks, though discontinued use roughly 10 days prior.  Denied alcohol use.  Denied Tylenol use.  Denied recent travel.  Born in the [place], family from [place].  Last febrile on 12/16/2023 102 Fahrenheit.  Past Medical History:  Diagnosis Date   Seasonal allergies    Past Surgical History:  Procedure Laterality Date   APPENDECTOMY     Prior to Admission medications   Medication Sig Start Date End  Date Taking? Authorizing Provider  Adapalene-Benzoyl Peroxide 0.1-2.5 % gel Apply 1 application  topically at bedtime. Start initially 1-2 times a week, then every other day, then daily after dryness improved. 11/23/23   Levin Erp, MD  cetirizine (ZYRTEC) 10 MG tablet Take 1 tablet (10 mg total) by mouth daily. 01/31/21   Shirlean Mylar, MD  diphenhydrAMINE (BENADRYL) 25 MG tablet Take 1 tablet (25 mg total) by mouth every 6 (six) hours as needed. This medication can cause drowsiness. 09/30/21   Rhys Martini, PA-C  doxycycline (VIBRA-TABS) 100 MG tablet Take 1 tablet (100 mg total) by mouth daily. 11/23/23   Levin Erp, MD  ibuprofen (ADVIL) 200 MG tablet Take 2 tablets (400 mg total) by mouth every 6 (six) hours as needed for mild pain or moderate pain. Patient not taking: Reported on 10/03/2021 12/28/19   Garnette Gunner, MD   Current Facility-Administered Medications  Medication Dose Route Frequency Provider Last Rate Last Admin   albuterol (PROVENTIL) (2.5 MG/3ML) 0.083% nebulizer solution 2.5 mg  2.5 mg Nebulization Q2H PRN Kirby Crigler, Mir M, MD       enoxaparin (LOVENOX) injection 40 mg  40 mg Subcutaneous Q24H Kirby Crigler, Mir M, MD       ibuprofen (ADVIL) tablet 400 mg  400 mg Oral Q6H PRN Kirby Crigler, Mir M, MD       ondansetron [facility]) tablet 4 mg  4 mg Oral Q6H PRN Kirby Crigler, Mir M, MD       Or   ondansetron [facility]) injection 4 mg  4 mg Intravenous Q6H PRN Kirby Crigler, Mir M, MD       traZODone (DESYREL) tablet 25 mg  25 mg Oral QHS PRN Maryln Gottron, MD  Current Outpatient Medications  Medication Sig Dispense Refill   Adapalene-Benzoyl Peroxide 0.1-2.5 % gel Apply 1 application  topically at bedtime. Start initially 1-2 times a week, then every other day, then daily after dryness improved. 45 g 0   cetirizine (ZYRTEC) 10 MG tablet Take 1 tablet (10 mg total) by mouth daily. 30 tablet 3   diphenhydrAMINE (BENADRYL) 25 MG tablet Take 1 tablet (25 mg total) by  mouth every 6 (six) hours as needed. This medication can cause drowsiness. 30 tablet 0   doxycycline (VIBRA-TABS) 100 MG tablet Take 1 tablet (100 mg total) by mouth daily. 30 tablet 0   ibuprofen (ADVIL) 200 MG tablet Take 2 tablets (400 mg total) by mouth every 6 (six) hours as needed for mild pain or moderate pain. (Patient not taking: Reported on 10/03/2021) 30 tablet 0   Allergies as of 12/17/2023   (No Known Allergies)   Family History  Problem Relation Age of Onset   Hypertension Mother    Hypertension Father    Diabetes Maternal Grandmother    Cancer - Cervical Maternal Grandmother    Diabetes Maternal Grandfather    Heart attack Maternal Grandfather    Breast cancer Maternal Aunt    Heart disease Maternal Uncle    Social History   Socioeconomic History   Marital status: Single    Spouse name: Not on file   Number of children: Not on file   Years of education: Not on file   Highest education level: Not on file  Occupational History   Not on file  Tobacco Use   Smoking status: Every Day    Types: E-cigarettes   Smokeless tobacco: Never  Vaping Use   Vaping status: Every Day  Substance and Sexual Activity   Alcohol use: Never   Drug use: Yes    Frequency: 7.0 times per week    Types: Marijuana   Sexual activity: Yes    Birth control/protection: None  Other Topics Concern   Not on file  Social History Narrative   Not on file   Social Drivers of Health   Financial Resource Strain: Not on file  Food Insecurity: Not on file  Transportation Needs: Not on file  Physical Activity: Not on file  Stress: Not on file  Social Connections: Not on file  Intimate Partner Violence: Not on file   Review of Systems:  Review of Systems  Constitutional:  Positive for fever.  Respiratory:  Negative for shortness of breath.   Cardiovascular:  Negative for chest pain.  Gastrointestinal:  Positive for diarrhea. Negative for abdominal pain, blood in stool, nausea and  vomiting.  Musculoskeletal:  Positive for joint pain and myalgias.    OBJECTIVE:   Temp:  [97.6 F (36.4 C)] 97.6 F (36.4 C) (01/31 1415) Pulse Rate:  [63-64] 63 (01/31 1420) Resp:  [18] 18 (01/31 1415) BP: (113)/(76) 113/76 (01/31 1415) SpO2:  [100 %] 100 % (01/31 1420) Weight:  [53.5 kg] 53.5 kg (01/31 1421)   Physical Exam Constitutional:      General: He is not in acute distress.    Appearance: He is not ill-appearing, toxic-appearing or diaphoretic.  Cardiovascular:     Rate and Rhythm: Normal rate and regular rhythm.  Pulmonary:     Effort: No respiratory distress.     Breath sounds: Normal breath sounds.  Abdominal:     General: Bowel sounds are normal. There is no distension.     Palpations: Abdomen is soft.  Tenderness: There is no abdominal tenderness. There is no guarding.  Musculoskeletal:     Right lower leg: No edema.     Left lower leg: No edema.  Skin:    General: Skin is warm and dry.     Coloration: Skin is not jaundiced.  Neurological:     Mental Status: He is alert.     Comments: Awake and alert, not confused.     Labs: Recent Labs    12/16/23 1431 12/17/23 1504  WBC 4.7 4.7  HGB 14.4 13.3  HCT 42.9 40.1  PLT 220 243   BMET Recent Labs    12/17/23 1504  NA 133*  K 4.1  CL 99  CO2 26  GLUCOSE 104*  BUN 11  CREATININE 0.58*  CALCIUM 9.1   LFT Recent Labs    12/16/23 1431 12/17/23 1504  PROT 8.1 8.2*  ALBUMIN 4.6 4.0  AST 901* 910*  ALT 228* 267*  ALKPHOS 51 37*  BILITOT 0.5 0.9  BILIDIR 0.20  --    PT/INR Recent Labs    12/17/23 1720  LABPROT 15.5*  INR 1.2   Diagnostic imaging: US Abdomen Limited RUQ (LIVER/GB) Result Date: 12/17/2023 CLINICAL DATA:  Elevated liver function tests EXAM: ULTRASOUND ABDOMEN LIMITED RIGHT UPPER QUADRANT COMPARISON:  09/09/2009 CT FINDINGS: Gallbladder: No gallstones or wall thickening visualized. No sonographic Murphy sign noted by sonographer. Common bile duct: Diameter: Normal, 1  mm Liver: No focal lesion identified. Within normal limits in parenchymal echogenicity. Portal vein is patent on color Doppler imaging with normal direction of blood flow towards the liver. Other: None. IMPRESSION: Normal right upper quadrant ultrasound. No explanation for elevated liver function tests. Electronically Signed   By: Jeronimo Greaves M.D.   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) due to isotretinoin which improved/resolved after 6 weeks with steroids and azathioprine therapy -Current liver enzyme panel appears similar in pattern -Follow-up viral hepatitis panel -For completeness, check IgG total, ANA, anti-smooth muscle antibody, antimitochondrial antibody, CMV DNA by PCR, HSV DNA by PCR, ceruloplasmin, iron and ferritin, alpha 1 antitrypsin -Trend liver enzymes tomorrow a.m., if continues to uptrend, will plan to consult interventional radiology to consider liver biopsy, this was discussed with patient -Okay for diet as tolerated -Avoidance of all alcohol -[facility] will follow   LOS: 0 days   Liliane Shi, DO [facility] Gastroenterology

## 2023-12-17 NOTE — H&P (Signed)
History and Physical  Dale Moyer ION:[id number] DOB: 2001-10-23 DOA: 12/17/2023  PCP: Erick Alley, DO   Chief Complaint: Abnormal liver function test  HPI: Dale Moyer is a healthy 23 year old male with acne who was recently started on Accutane about 14 days ago, being admitted to the hospital for evaluation and management of worsening LFTs.  Several days after starting this medication, he developed some bodyaches, mild itchy rash on his arms and lower back.  Seen by his PCP at the family medicine practice center at [facility] on 1/28, lab work revealed elevated LFTs.  Per primary care documentation, patient stated that he was feeling better by the following day, although he did have a fever of 102.4.  He was advised to stop taking his Accutane and labs were repeated which showed an elevation of his AST and ALT.  He was advised to go to [facility], ER for further evaluation and possible admission due to the upcoming weekend.  Currently the patient confirms that he has not had a had fever since 1/29, his rash and itchiness are resolved.  Family members at the bedside state that he had also exercised quite strenuously a few days ago.  He only uses very occasional over-the-counter Tylenol, no significant alcohol consumption.  Review of Systems: Please see HPI for pertinent positives and negatives. A complete 10 system review of systems are otherwise negative.  Past Medical History:  Diagnosis Date   Seasonal allergies    Past Surgical History:  Procedure Laterality Date   APPENDECTOMY     Social History:  reports that he has been smoking e-cigarettes. He has never used smokeless tobacco. He reports current drug use. Frequency: 7.00 times per week. Drug: Marijuana. He reports that he does not drink alcohol.  No Known Allergies  Family History  Problem Relation Age of Onset   Hypertension Mother    Hypertension Father    Diabetes Maternal Grandmother    Cancer - Cervical Maternal Grandmother     Diabetes Maternal Grandfather    Heart attack Maternal Grandfather    Breast cancer Maternal Aunt    Heart disease Maternal Uncle      Prior to Admission medications   Medication Sig Start Date End Date Taking? Authorizing Provider  Adapalene-Benzoyl Peroxide 0.1-2.5 % gel Apply 1 application  topically at bedtime. Start initially 1-2 times a week, then every other day, then daily after dryness improved. 11/23/23   Levin Erp, MD  cetirizine (ZYRTEC) 10 MG tablet Take 1 tablet (10 mg total) by mouth daily. 01/31/21   Shirlean Mylar, MD  diphenhydrAMINE (BENADRYL) 25 MG tablet Take 1 tablet (25 mg total) by mouth every 6 (six) hours as needed. This medication can cause drowsiness. 09/30/21   Rhys Martini, PA-C  doxycycline (VIBRA-TABS) 100 MG tablet Take 1 tablet (100 mg total) by mouth daily. 11/23/23   Levin Erp, MD  ibuprofen (ADVIL) 200 MG tablet Take 2 tablets (400 mg total) by mouth every 6 (six) hours as needed for mild pain or moderate pain. Patient not taking: Reported on 10/03/2021 12/28/19   Garnette Gunner, MD    Physical Exam: BP 113/76 (BP Location: Left Arm)   Pulse 64   Temp 97.6 F (36.4 C) (Oral)   Resp 18   Ht 5\' 7"  (1.702 m)   Wt 53.5 kg   SpO2 100%   BMI 18.48 kg/m  General:  Alert, oriented, calm, in no acute distress, multiple family members at the bedside Eyes: EOMI, clear conjuctivae,  white sclerea Neck: supple, no masses, trachea mildline  Cardiovascular: RRR, no murmurs or rubs, no peripheral edema  Respiratory: clear to auscultation bilaterally, no wheezes, no crackles  Abdomen: soft, nontender, nondistended, normal bowel tones heard  Skin: dry, no rashes  Musculoskeletal: no joint effusions, normal range of motion  Psychiatric: appropriate affect, normal speech  Neurologic: extraocular muscles intact, clear speech, moving all extremities with intact sensorium         Labs on Admission:  Basic Metabolic Panel: Recent Labs  Lab  12/14/23 1619 12/17/23 1504  NA 136 133*  K 4.7 4.1  CL 99 99  CO2 24 26  GLUCOSE 98 104*  BUN 10 11  CREATININE 0.91 0.58*  CALCIUM 9.0 9.1   Liver Function Tests: Recent Labs  Lab 12/14/23 1619 12/16/23 1431 12/17/23 1504  AST 365* 901* 910*  ALT 94* 228* 267*  ALKPHOS 50 51 37*  BILITOT 0.3 0.5 0.9  PROT 7.9 8.1 8.2*  ALBUMIN 4.5 4.6 4.0   Recent Labs  Lab 12/17/23 1504  LIPASE 29   No results for input(s): "AMMONIA" in the last 168 hours. CBC: Recent Labs  Lab 12/14/23 1619 12/16/23 1431 12/17/23 1504  WBC 3.4 4.7 4.7  NEUTROABS 2.0 2.2  --   HGB 13.9 14.4 13.3  HCT 40.3 42.9 40.1  MCV 91 92 92.2  PLT 174 220 243   Cardiac Enzymes: No results for input(s): "CKTOTAL", "CKMB", "CKMBINDEX", "TROPONINI" in the last 168 hours. BNP (last 3 results) No results for input(s): "BNP" in the last 8760 hours.  ProBNP (last 3 results) No results for input(s): "PROBNP" in the last 8760 hours.  CBG: No results for input(s): "GLUCAP" in the last 168 hours.  Radiological Exams on Admission: US Abdomen Limited RUQ (LIVER/GB) Result Date: 12/17/2023 CLINICAL DATA:  Elevated liver function tests EXAM: ULTRASOUND ABDOMEN LIMITED RIGHT UPPER QUADRANT COMPARISON:  09/09/2009 CT FINDINGS: Gallbladder: No gallstones or wall thickening visualized. No sonographic Murphy sign noted by sonographer. Common bile duct: Diameter: Normal, 1 mm Liver: No focal lesion identified. Within normal limits in parenchymal echogenicity. Portal vein is patent on color Doppler imaging with normal direction of blood flow towards the liver. Other: None. IMPRESSION: Normal right upper quadrant ultrasound. No explanation for elevated liver function tests. Electronically Signed   By: Jeronimo Greaves M.D.   On: 12/17/2023 17:23   Assessment/Plan Dale Moyer is a healthy 23 year old male with acne who was recently started on Accutane about 14 days ago, being admitted to the hospital for evaluation and  management of worsening LFTs.   Abnormal LFTs-Accutane effect is quite possible (abnormal LFTs encountered in up to 10% of cases), he discontinued this medication on 1/29.  Differential is broad, will plan on workup as below.  Note unremarkable right upper quadrant ultrasound. -Observation admission -Avoid hepatotoxins -Check INR, ANA, IgG, anti-smooth muscle antibody, antimitochondrial antibody, alpha-1 antitrypsin, ceruloplasmin, acute hepatitis panel, ferritin, iron and TIBC, HSV, CMV, Tylenol level -ER provider discussed with Dr. Lorenso Quarry of [facility], who will also consult -Follow LFT trend with morning labs  DVT prophylaxis: Lovenox     Code Status: Full Code  Consults called: GI Dr. Lorenso Quarry  Admission status: Observation  Time spent: 48 minutes  Dale Moyer Sharlette Dense MD [facility] Pager [phone]  If 7PM-7AM, please contact night-coverage [email or web address] Password [facility]  12/17/2023, 5:44 PM

## 2023-12-17 NOTE — ED Triage Notes (Signed)
Patient sent by PCP for elevated AST and ALT. Patient reports mild abdominal pain. Denies medical hx. Reports he does not drink.

## 2023-12-17 NOTE — Telephone Encounter (Addendum)
Called pt yesterday afternoon to check in -he states he is feeling better, still having mild knee and neck pains.  No longer having headache or eye pressure, no vision changes.  Rash on arms has cleared but back is still a little itchy.  Had labs drawn.  Advised patient to continue to hold Accutane and we wait for labs to return.  Labs are back this morning showing LFTs continue to trend up, AST 365>901 ALT 94>228 CBC with differential still WNL.  Mono came back negative.   Hepatitis panel not yet back.  Called pt who states he continues to feel better, no longer having muscle aches.  Advised that he go to [facility] for repeat labs and further evaluation given elevated LFTs especially since we are going into the weekend.  Pt agrees and will head to ED.  Consider viral hepatitis vs autoimmune hepatitis vs rx to NSAID vs adverse effect of Accutane (however, unsure if this would explain fever)

## 2023-12-17 NOTE — ED Provider Notes (Signed)
[place] EMERGENCY DEPARTMENT AT [facility] Provider Note   CSN: [id number] Arrival date & time: 12/17/23  1354     History  Chief Complaint  Patient presents with   Abnormal Lab    Dale Moyer is a 23 y.o. male.  HPI    24 year old patient comes in with chief complaint of abnormal labs. Patient has past medical history of acne for which he was started on Accutane about 2 weeks ago.  Last week patient started developing URI-like symptoms, which included sore throat, body aches, fevers, chills.  He went to see his PCP.  His LFTs were elevated.  He was advised to return for repeat blood work.  LFTs were recent along with hepatitis panel, monoscreen and they are still pending.  The LFTs did return and the LFTs are higher than they were previously, therefore patient was advised to come to the ER.  Patient states that in general, he feels better than he did on Sunday and Monday.  He continues to have some enlarged lymph nodes over his throat area.  Patient admits to vaping and marijuana use, but denies any IV drug use history, heavy Tylenol use, or any alcohol consumption.  Home Medications Prior to Admission medications   Medication Sig Start Date End Date Taking? Authorizing Provider  Adapalene-Benzoyl Peroxide 0.1-2.5 % gel Apply 1 application  topically at bedtime. Start initially 1-2 times a week, then every other day, then daily after dryness improved. 11/23/23   Levin Erp, MD  cetirizine (ZYRTEC) 10 MG tablet Take 1 tablet (10 mg total) by mouth daily. 01/31/21   Shirlean Mylar, MD  diphenhydrAMINE (BENADRYL) 25 MG tablet Take 1 tablet (25 mg total) by mouth every 6 (six) hours as needed. This medication can cause drowsiness. 09/30/21   Rhys Martini, PA-C  doxycycline (VIBRA-TABS) 100 MG tablet Take 1 tablet (100 mg total) by mouth daily. 11/23/23   Levin Erp, MD  ibuprofen (ADVIL) 200 MG tablet Take 2 tablets (400 mg total) by mouth every 6 (six) hours  as needed for mild pain or moderate pain. Patient not taking: Reported on 10/03/2021 12/28/19   Garnette Gunner, MD      Allergies    Patient has no known allergies.    Review of Systems   Review of Systems  All other systems reviewed and are negative.   Physical Exam Updated Vital Signs BP 113/76 (BP Location: Left Arm)   Pulse 64   Temp 97.6 F (36.4 C) (Oral)   Resp 18   Ht 5\' 7"  (1.702 m)   Wt 53.5 kg   SpO2 100%   BMI 18.48 kg/m  Physical Exam Vitals and nursing note reviewed.  Constitutional:      Appearance: He is well-developed.  HENT:     Head: Atraumatic.  Eyes:     General: No scleral icterus.    Extraocular Movements: Extraocular movements intact.     Pupils: Pupils are equal, round, and reactive to light.  Cardiovascular:     Rate and Rhythm: Normal rate.  Pulmonary:     Effort: Pulmonary effort is normal.  Abdominal:     Palpations: There is no mass.     Tenderness: There is no abdominal tenderness.  Musculoskeletal:     Cervical back: Neck supple.  Skin:    General: Skin is warm.  Neurological:     Mental Status: He is alert and oriented to person, place, and time.     ED Results /  Procedures / Treatments   Labs (all labs ordered are listed, but only abnormal results are displayed) Labs Reviewed  COMPREHENSIVE METABOLIC PANEL - Abnormal; Notable for the following components:      Result Value   Sodium 133 (*)    Glucose, Bld 104 (*)    Creatinine, Ser 0.58 (*)    Total Protein 8.2 (*)    AST 910 (*)    ALT 267 (*)    Alkaline Phosphatase 37 (*)    All other components within normal limits  CBC  LIPASE, BLOOD  HEPATITIS PANEL, ACUTE  MONONUCLEOSIS SCREEN  PROTIME-INR  APTT  IGG  ANA  ANTI-SMOOTH MUSCLE ANTIBODY, IGG  MITOCHONDRIAL ANTIBODIES  ETHANOL  ACETAMINOPHEN LEVEL  CMV DNA BY PCR, QUALITATIVE  HSV DNA BY PCR (REFERENCE LAB)  CERULOPLASMIN  IRON AND TIBC  FERRITIN  ALPHA-1-ANTITRYPSIN  HEPATIC FUNCTION PANEL   PROTIME-INR    EKG None  Radiology US Abdomen Limited RUQ (LIVER/GB) Result Date: 12/17/2023 CLINICAL DATA:  Elevated liver function tests EXAM: ULTRASOUND ABDOMEN LIMITED RIGHT UPPER QUADRANT COMPARISON:  09/09/2009 CT FINDINGS: Gallbladder: No gallstones or wall thickening visualized. No sonographic Murphy sign noted by sonographer. Common bile duct: Diameter: Normal, 1 mm Liver: No focal lesion identified. Within normal limits in parenchymal echogenicity. Portal vein is patent on color Doppler imaging with normal direction of blood flow towards the liver. Other: None. IMPRESSION: Normal right upper quadrant ultrasound. No explanation for elevated liver function tests. Electronically Signed   By: Jeronimo Greaves M.D.   On: 12/17/2023 17:23    Procedures Procedures    Medications Ordered in ED Medications - No data to display  ED Course/ Medical Decision Making/ A&P                                 Medical Decision Making Amount and/or Complexity of Data Reviewed Labs: ordered. Radiology: ordered.  Risk Decision regarding hospitalization.  This patient presents to the ED with chief complaint(s) of elevated LFTs with pertinent past medical history of URI-like symptoms last week, from which she has recovering and also use of Accutane over the past 2 weeks.The complaint involves an extensive differential diagnosis and also carries with it a high risk of complications and morbidity.    The differential diagnosis includes : Acute hepatitis because of viral infection, toxic effects from drug use, autoimmune liver disease, medication side effects, inflammatory bowel disease, hepatobiliary condition such as PBC, PSC.  The initial plan is to repeat LFTs, get ultrasound right upper quadrant and get PT and INR. I have sent hepatitis panel as well along with monoscreen.  Patient has no splenomegaly.   Additional history obtained: Additional history obtained from family Records reviewed  Primary Care Documents Patient denies any autoimmune conditions in the family, there is no IBD.  Independent labs interpretation:  The following labs were independently interpreted: LFTs remain elevated with AST 910, ALT 267.  AST to ALT ratio is greater than 3, but patient denies any alcohol consumption. Bilirubin is normal.  I reviewed the ultrasound abdomen independently.  No evidence of gallstones.  Consultation: - Consulted or discussed management/test interpretation with external professional: Spoke with GI team.  They recommended patient be admitted overnight for observation and repeat/trending of LFTs.  They will also follow the patient and decide if he needs biopsy or additional workup for autoimmune hepatitis.  Final Clinical Impression(s) / ED Diagnoses Final diagnoses:  Elevated LFTs  Acute hepatitis    Rx / DC Orders ED Discharge Orders     None         Derwood Kaplan, MD 12/17/23 1742

## 2023-12-17 NOTE — ED Notes (Signed)
Gave pt apple juice Rn made aware

## 2023-12-18 DIAGNOSIS — M6282 Rhabdomyolysis: Secondary | ICD-10-CM | POA: Diagnosis not present

## 2023-12-18 DIAGNOSIS — F17290 Nicotine dependence, other tobacco product, uncomplicated: Secondary | ICD-10-CM | POA: Diagnosis not present

## 2023-12-18 DIAGNOSIS — Z803 Family history of malignant neoplasm of breast: Secondary | ICD-10-CM | POA: Diagnosis not present

## 2023-12-18 DIAGNOSIS — Z8249 Family history of ischemic heart disease and other diseases of the circulatory system: Secondary | ICD-10-CM | POA: Diagnosis not present

## 2023-12-18 DIAGNOSIS — M25562 Pain in left knee: Secondary | ICD-10-CM | POA: Diagnosis not present

## 2023-12-18 DIAGNOSIS — B179 Acute viral hepatitis, unspecified: Secondary | ICD-10-CM | POA: Diagnosis not present

## 2023-12-18 DIAGNOSIS — R7989 Other specified abnormal findings of blood chemistry: Secondary | ICD-10-CM | POA: Diagnosis not present

## 2023-12-18 DIAGNOSIS — R7401 Elevation of levels of liver transaminase levels: Secondary | ICD-10-CM | POA: Diagnosis not present

## 2023-12-18 DIAGNOSIS — Z79899 Other long term (current) drug therapy: Secondary | ICD-10-CM | POA: Diagnosis not present

## 2023-12-18 DIAGNOSIS — R945 Abnormal results of liver function studies: Secondary | ICD-10-CM | POA: Diagnosis not present

## 2023-12-18 DIAGNOSIS — R197 Diarrhea, unspecified: Secondary | ICD-10-CM | POA: Diagnosis not present

## 2023-12-18 DIAGNOSIS — Z91014 Allergy to mammalian meats: Secondary | ICD-10-CM | POA: Diagnosis not present

## 2023-12-18 DIAGNOSIS — Z833 Family history of diabetes mellitus: Secondary | ICD-10-CM | POA: Diagnosis not present

## 2023-12-18 DIAGNOSIS — K719 Toxic liver disease, unspecified: Secondary | ICD-10-CM | POA: Diagnosis not present

## 2023-12-18 DIAGNOSIS — K759 Inflammatory liver disease, unspecified: Secondary | ICD-10-CM | POA: Diagnosis not present

## 2023-12-18 DIAGNOSIS — R599 Enlarged lymph nodes, unspecified: Secondary | ICD-10-CM | POA: Diagnosis not present

## 2023-12-18 DIAGNOSIS — M25561 Pain in right knee: Secondary | ICD-10-CM | POA: Diagnosis not present

## 2023-12-18 LAB — COMPREHENSIVE METABOLIC PANEL
ALT: 236 U/L — ABNORMAL HIGH (ref 0–44)
AST: 636 U/L — ABNORMAL HIGH (ref 15–41)
Albumin: 3.6 g/dL (ref 3.5–5.0)
Alkaline Phosphatase: 34 U/L — ABNORMAL LOW (ref 38–126)
Anion gap: 7 (ref 5–15)
BUN: 22 mg/dL — ABNORMAL HIGH (ref 6–20)
CO2: 27 mmol/L (ref 22–32)
Calcium: 8.7 mg/dL — ABNORMAL LOW (ref 8.9–10.3)
Chloride: 102 mmol/L (ref 98–111)
Creatinine, Ser: 0.59 mg/dL — ABNORMAL LOW (ref 0.61–1.24)
GFR, Estimated: >60 mL/min (ref >60)
Glucose, Bld: 101 mg/dL — ABNORMAL HIGH (ref 70–99)
Potassium: 4.3 mmol/L (ref 3.5–5.1)
Sodium: 136 mmol/L (ref 135–145)
Total Bilirubin: 0.6 mg/dL (ref 0.0–1.2)
Total Protein: 7.4 g/dL (ref 6.5–8.1)

## 2023-12-18 LAB — PROTIME-INR
INR: 1.2 (ref 0.8–1.2)
Prothrombin Time: 15.4 s — ABNORMAL HIGH (ref 11.4–15.2)

## 2023-12-18 LAB — ALPHA-1-ANTITRYPSIN: A-1 Antitrypsin, Ser: 202 mg/dL — ABNORMAL HIGH (ref 95–164)

## 2023-12-18 LAB — IGG: IgG (Immunoglobin G), Serum: 1888 mg/dL — ABNORMAL HIGH (ref 603–1613)

## 2023-12-18 LAB — ANA: Anti Nuclear Antibody (ANA): POSITIVE — AB

## 2023-12-18 LAB — ACUTE VIRAL HEPATITIS (HAV, HBV, HCV)

## 2023-12-18 LAB — HCV INTERPRETATION

## 2023-12-18 LAB — SPECIMEN STATUS REPORT

## 2023-12-18 LAB — CERULOPLASMIN: Ceruloplasmin: 34.2 mg/dL — ABNORMAL HIGH (ref 16.0–31.0)

## 2023-12-18 MED ORDER — BENZOCAINE 10 % MT GEL
Freq: Two times a day (BID) | OROMUCOSAL | Status: DC | PRN
  Filled 2023-12-18: qty 9.4

## 2023-12-18 MED ORDER — SODIUM CHLORIDE 0.9 % IV SOLN: INTRAVENOUS | Status: AC

## 2023-12-18 NOTE — Hospital Course (Signed)
Clinic

## 2023-12-18 NOTE — Progress Notes (Signed)
[facility] Progress Note  SUBJECTIVE:   Interval history: Dale Moyer was seen and evaluated today at bedside. Resting comfortably in bed. Brother at bedside. Liver enzyme trend reviewed with patient and his family. No abdominal pain. No nausea or vomiting. No chest pain or shortness of breath. No bowel movement today.  Past Medical History:  Diagnosis Date   Seasonal allergies    Past Surgical History:  Procedure Laterality Date   APPENDECTOMY     Current Facility-Administered Medications  Medication Dose Route Frequency Provider Last Rate Last Admin   0.9 %  sodium chloride infusion   Intravenous Continuous Rhetta Mura, MD 150 mL/hr at 12/18/23 0836 New Bag at 12/18/23 0836   albuterol (PROVENTIL) (2.5 MG/3ML) 0.083% nebulizer solution 2.5 mg  2.5 mg Nebulization Q2H PRN Kirby Crigler, Mir M, MD       benzocaine (ORAJEL) 10 % mucosal gel   Mouth/Throat BID PRN Rhetta Mura, MD       enoxaparin (LOVENOX) injection 40 mg  40 mg Subcutaneous Q24H Ikramullah, Mir M, MD       ibuprofen (ADVIL) tablet 400 mg  400 mg Oral Q6H PRN Kirby Crigler, Mir M, MD       ondansetron [facility]) tablet 4 mg  4 mg Oral Q6H PRN Kirby Crigler, Mir M, MD       Or   ondansetron [facility]) injection 4 mg  4 mg Intravenous Q6H PRN Kirby Crigler, Mir M, MD       traZODone (DESYREL) tablet 25 mg  25 mg Oral QHS PRN Maryln Gottron, MD       Allergies as of 12/17/2023   (No Known Allergies)   Review of Systems:  Review of Systems  Respiratory:  Negative for shortness of breath.   Cardiovascular:  Negative for chest pain.  Gastrointestinal:  Negative for abdominal pain, constipation, diarrhea, nausea and vomiting.    OBJECTIVE:   Temp:  [97.5 F (36.4 C)-97.9 F (36.6 C)] 97.5 F (36.4 C) (02/01 1435) Pulse Rate:  [56-81] 72 (02/01 1435) Resp:  [17-18] 18 (02/01 1435) BP: (104-126)/(56-106) 116/67 (02/01 1435) SpO2:  [97 %-100 %] 100 % (02/01 1435)   Physical Exam Constitutional:       General: He is not in acute distress.    Appearance: He is not ill-appearing, toxic-appearing or diaphoretic.  Cardiovascular:     Rate and Rhythm: Normal rate and regular rhythm.  Pulmonary:     Effort: No respiratory distress.     Breath sounds: Normal breath sounds.  Abdominal:     General: Bowel sounds are normal. There is no distension.     Palpations: Abdomen is soft.     Tenderness: There is no abdominal tenderness.  Skin:    General: Skin is warm and dry.  Neurological:     Mental Status: He is alert and oriented to person, place, and time.     Labs: Recent Labs    12/16/23 1431 12/17/23 1504  WBC 4.7 4.7  HGB 14.4 13.3  HCT 42.9 40.1  PLT 220 243   BMET Recent Labs    12/17/23 1504 12/18/23 0558  NA 133* 136  K 4.1 4.3  CL 99 102  CO2 26 27  GLUCOSE 104* 101*  BUN 11 22*  CREATININE 0.58* 0.59*  CALCIUM 9.1 8.7*   LFT Recent Labs    12/16/23 1431 12/17/23 1504 12/18/23 0558  PROT 8.1   < > 7.4  ALBUMIN 4.6   < > 3.6  AST 901*   < >  636*  ALT 228*   < > 236*  ALKPHOS 51   < > 34*  BILITOT 0.5   < > 0.6  BILIDIR 0.20  --   --    < > = values in this interval not displayed.   PT/INR Recent Labs    12/17/23 1720 12/18/23 0558  LABPROT 15.5* 15.4*  INR 1.2 1.2   Diagnostic imaging: US Abdomen Limited RUQ (LIVER/GB) Result Date: 12/17/2023 CLINICAL DATA:  Elevated liver function tests EXAM: ULTRASOUND ABDOMEN LIMITED RIGHT UPPER QUADRANT COMPARISON:  09/09/2009 CT FINDINGS: Gallbladder: No gallstones or wall thickening visualized. No sonographic Murphy sign noted by sonographer. Common bile duct: Diameter: Normal, 1 mm Liver: No focal lesion identified. Within normal limits in parenchymal echogenicity. Portal vein is patent on color Doppler imaging with normal direction of blood flow towards the liver. Other: None. IMPRESSION: Normal right upper quadrant ultrasound. No explanation for elevated liver function tests. Electronically Signed   By:  Jeronimo Greaves M.D.   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

## 2023-12-18 NOTE — Progress Notes (Addendum)
TRH ROUNDING   NOTE Woodford Strege UJW:[id number]  DOB: September 15, 2001  DOA: 12/17/2023  PCP: Erick Alley, DO  12/18/2023,7:33 AM   LOS: 0 days      Code Status: Full code From: Home  current Dispo: Likely home   23 year old male with severe acne-seen at family medicine clinic 11/23/2023 prescribed doxycycline and adapalene and was referred to dermatology Dermatologist prescribed Accutane which patient started taking on 1/17-he also started taking creatine supplement over several weeks Followed up with family medicine clinic on 1/28 with URI symptoms sore throat bilateral knee pain diarrhea-- screening labs including CBC Chem-12 showed significantly elevated LFTs AST/ALT 365/94 with CK20 7000 LDH 900-patient was advised to stop the Accutane Patient referred to emergency room by PCP came to [facility]  GI consulted  Procedures  Plan  Transaminitis in the setting of recent rash and recent start Accutane Unclear precipitating event but sounds very suspicious for either side effect of Accutane and or effect of creatinine powder-uses "Raw" brand Awaiting IgG, ANA, ASMA, antimitochondrial antibody CMV DNA HSV DNA--- Monospot test in the outpatient setting was negative, viral hepatitis panel negative HIV rapid strep RPR negative Liver function is trending downward but if preliminary testing 24 to 48 hours and liver enzymes trend downward communication to discharge for close follow-up in the outpatient setting to follow-up labs and decide on planning- I am less suspicious that this is related to anything autoimmune but with elevated CK levels that may be a consideration Rhabdo on admission CK level 27,000.  LDH additionally Give IVF 150 cc/H and hopefully when below 5000 can DC home Severe acne In the future should not take any further Accutane products unless we know that this was not the cause  DVT prophylaxis: lovenox  Status is: Observation The patient will require care spanning > 2 midnights and  should be moved to inpatient because:   Requires resolution of LFTs etc.  Subjective: Awake coherent pleasant no rash no fever no chills no nausea no vomiting ROM intact  Objective + exam Vitals:   12/18/23 0200 12/18/23 0400 12/18/23 0500 12/18/23 0557  BP: 122/67 (!) 112/59 111/61   Pulse: 78 61 66   Resp: 18  18   Temp:    97.7 F (36.5 C)  TempSrc:    Oral  SpO2: 97% 98% 99%   Weight:      Height:       Filed Weights   12/17/23 1421  Weight: 53.5 kg    Examination: EOMI NCAT no focal deficit no icterus no pallor Chest is clear Slightly enlarged liver right upper quadrant Abdomen soft otherwise No lower extremity edema No rash  Data Reviewed: reviewed   CBC    Component Value Date/Time   WBC 4.7 12/17/2023 1504   RBC 4.35 12/17/2023 1504   HGB 13.3 12/17/2023 1504   HGB 14.4 12/16/2023 1431   HCT 40.1 12/17/2023 1504   HCT 42.9 12/16/2023 1431   PLT 243 12/17/2023 1504   PLT 220 12/16/2023 1431   MCV 92.2 12/17/2023 1504   MCV 92 12/16/2023 1431   MCH 30.6 12/17/2023 1504   MCHC 33.2 12/17/2023 1504   RDW 12.3 12/17/2023 1504   RDW 12.3 12/16/2023 1431   LYMPHSABS 2.0 12/16/2023 1431   MONOABS 0.4 09/10/2009 1330   EOSABS 0.0 12/16/2023 1431   BASOSABS 0.0 12/16/2023 1431      Latest Ref Rng & Units 12/18/2023    5:58 AM 12/17/2023    3:04 PM 12/16/2023  2:31 PM  CMP  Glucose 70 - 99 mg/dL 161  096    BUN 6 - 20 mg/dL 22  11    Creatinine 0.45 - 1.24 mg/dL 4.09  8.11    Sodium 914 - 145 mmol/L 136  133    Potassium 3.5 - 5.1 mmol/L 4.3  4.1    Chloride 98 - 111 mmol/L 102  99    CO2 22 - 32 mmol/L 27  26    Calcium 8.9 - 10.3 mg/dL 8.7  9.1    Total Protein 6.5 - 8.1 g/dL 7.4  8.2  8.1   Total Bilirubin 0.0 - 1.2 mg/dL 0.6  0.9  0.5   Alkaline Phos 38 - 126 U/L 34  37  51   AST 15 - 41 U/L 636  910  901   ALT 0 - 44 U/L 236  267  228     Scheduled Meds:  enoxaparin (LOVENOX) injection  40 mg Subcutaneous Q24H   Continuous  Infusions:  Time  35  Rhetta Mura, MD  [facility]

## 2023-12-18 NOTE — Plan of Care (Signed)

## 2023-12-19 DIAGNOSIS — R7989 Other specified abnormal findings of blood chemistry: Secondary | ICD-10-CM | POA: Diagnosis not present

## 2023-12-19 LAB — COMPREHENSIVE METABOLIC PANEL
ALT: 204 U/L — ABNORMAL HIGH (ref 0–44)
AST: 440 U/L — ABNORMAL HIGH (ref 15–41)
Albumin: 3.1 g/dL — ABNORMAL LOW (ref 3.5–5.0)
Alkaline Phosphatase: 33 U/L — ABNORMAL LOW (ref 38–126)
Anion gap: 4 — ABNORMAL LOW (ref 5–15)
BUN: 11 mg/dL (ref 6–20)
CO2: 25 mmol/L (ref 22–32)
Calcium: 8.3 mg/dL — ABNORMAL LOW (ref 8.9–10.3)
Chloride: 107 mmol/L (ref 98–111)
Creatinine, Ser: 0.37 mg/dL — ABNORMAL LOW (ref 0.61–1.24)
GFR, Estimated: >60 mL/min (ref >60)
Glucose, Bld: 99 mg/dL (ref 70–99)
Potassium: 3.7 mmol/L (ref 3.5–5.1)
Sodium: 136 mmol/L (ref 135–145)
Total Bilirubin: 0.6 mg/dL (ref 0.0–1.2)
Total Protein: 6.3 g/dL — ABNORMAL LOW (ref 6.5–8.1)

## 2023-12-19 LAB — CBC WITH DIFFERENTIAL/PLATELET
Abs Immature Granulocytes: 0 10*3/uL (ref 0.00–0.07)
Basophils Absolute: 0 10*3/uL (ref 0.0–0.1)
Basophils Relative: 0 %
Eosinophils Absolute: 0.1 10*3/uL (ref 0.0–0.5)
Eosinophils Relative: 2 %
HCT: 31.3 % — ABNORMAL LOW (ref 39.0–52.0)
Hemoglobin: 10.4 g/dL — ABNORMAL LOW (ref 13.0–17.0)
Immature Granulocytes: 0 %
Lymphocytes Relative: 54 %
Lymphs Abs: 3 10*3/uL (ref 0.7–4.0)
MCH: 31.1 pg (ref 26.0–34.0)
MCHC: 33.2 g/dL (ref 30.0–36.0)
MCV: 93.7 fL (ref 80.0–100.0)
Monocytes Absolute: 0.6 10*3/uL (ref 0.1–1.0)
Monocytes Relative: 10 %
Neutro Abs: 1.9 10*3/uL (ref 1.7–7.7)
Neutrophils Relative %: 34 %
Platelets: 272 10*3/uL (ref 150–400)
RBC: 3.34 MIL/uL — ABNORMAL LOW (ref 4.22–5.81)
RDW: 12.2 % (ref 11.5–15.5)
WBC: 5.7 10*3/uL (ref 4.0–10.5)
nRBC: 0 % (ref 0.0–0.2)

## 2023-12-19 LAB — ANTI-SMOOTH MUSCLE ANTIBODY, IGG: F-Actin IgG: 13 U (ref 0–19)

## 2023-12-19 LAB — MITOCHONDRIAL ANTIBODIES: Mitochondrial M2 Ab, IgG: 20.3 U — ABNORMAL HIGH (ref 0.0–20.0)

## 2023-12-19 LAB — CK: Total CK: 9247 U/L — ABNORMAL HIGH (ref 49–397)

## 2023-12-19 MED ORDER — SODIUM CHLORIDE 0.9 % IV SOLN: INTRAVENOUS | Status: AC

## 2023-12-19 NOTE — Progress Notes (Signed)
BRIEF PROGRESS NOTE:   Presented to patient hospital room to evaluate patient. Not in room, not on floor, per nursing staff patient is on a walk. Based on lab testing and IM visit note, patient appears stable for discharge to home with close outpatient follow up with his PCP for repeat blood work. He can follow up with me in office in roughly 4 weeks (please have PCP send labs to my office). Will place my follow up information in chart for discharge.   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 Would not repeat use of Accutane. If liver enzymes stagnate in the outpatient setting, would recommend IR evaluation for liver biopsy and consider steroid and azathioprine medication therapy.   [facility] will sign off and plan to follow up with patient in outpatient setting.   Liliane Shi, DO [facility]

## 2023-12-19 NOTE — Plan of Care (Signed)

## 2023-12-19 NOTE — Plan of Care (Signed)
  Problem: Education: Goal: Knowledge of General Education information will improve Description: Including pain rating scale, medication(s)/side effects and non-pharmacologic comfort measures Outcome: Progressing   Problem: Health Behavior/Discharge Planning: Goal: Ability to manage health-related needs will improve Outcome: Progressing   Problem: Clinical Measurements: Goal: Ability to maintain clinical measurements within normal limits will improve Outcome: Progressing Goal: Will remain free from infection Outcome: Progressing Goal: Diagnostic test results will improve Outcome: Progressing Goal: Respiratory complications will improve Outcome: Progressing Goal: Cardiovascular complication will be avoided Outcome: Progressing   Problem: Coping: Goal: Level of anxiety will decrease Outcome: Progressing   Problem: Elimination: Goal: Will not experience complications related to bowel motility Outcome: Progressing Goal: Will not experience complications related to urinary retention Outcome: Progressing   Problem: Safety: Goal: Ability to remain free from injury will improve Outcome: Progressing

## 2023-12-19 NOTE — Progress Notes (Signed)
TRH ROUNDING   NOTE Dale Moyer [id number]  DOB: [birth year]  DOA: 12/17/2023  PCP: Erick Alley, DO  12/19/2023,8:52 AM   LOS: 1 day      Code Status: Full code From: Home  current Dispo: Likely home   23 year old male with severe acne-seen at family medicine clinic 11/23/2023 prescribed doxycycline and adapalene and was referred to dermatology Dermatologist prescribed Accutane which patient started taking on 1/17-he also started taking creatine supplement over several weeks Followed up with family medicine clinic on 1/28 with URI symptoms sore throat bilateral knee pain diarrhea-- screening labs including CBC Chem-12 showed significantly elevated LFTs AST/ALT 365/94 with CK20 7000 LDH 900-patient was advised to stop the Accutane Patient referred to emergency room by PCP came to [facility]  GI consulted  Procedures  Plan  Transaminitis in the setting of recent rash and recent start Accutane Unclear precipitating event but sounds very suspicious for either side effect of Accutane and or effect of creatinine powder-uses "Raw" brand Ceruloplasmin level 34 (normal is 32), ANA[+], IgG 1888, alpha-1 antitrypsin elevated at 202--- not sure if there is an autoimmune component presentation Discussed case with gastroenterology who feels it may be DILI overlap with autoimmune-as long as liver function seems to be improving okay for outpatient follow-up workup May require outpatient consideration for referral, repeat autoimmune labs and liver biopsy if recurs Rhabdo on admission CK level 27,000 trending downward fairly well Continue IVF 150 cc/H and hopefully when below 5000 can DC home Severe acne In the future should not take any further Accutane products unless we know that this was not the cause  DVT prophylaxis: lovenox  Status is: Observation The patient will require care spanning > 2 midnights and should be moved to inpatient because:   Requires resolution of LFTs  etc.  Subjective:  Looks okay feels well passing good urine no chest pain no fever no pruritus  Objective + exam Vitals:   12/18/23 1135 12/18/23 1435 12/18/23 2229 12/19/23 0246  BP: (!) 126/106 116/67 113/65 (!) 110/54  Pulse: 76 72 69 72  Resp: 17 18 18 18   Temp: 97.7 F (36.5 C) (!) 97.5 F (36.4 C) 97.7 F (36.5 C) 98 F (36.7 C)  TempSrc:      SpO2: 100% 100% 100% 99%  Weight:      Height:       Filed Weights   12/17/23 1421  Weight: 53.5 kg    Examination:  EOMI NCAT anicteric no pallor Chest clear S1-S2 no murmur no rub no gallop Abdomen soft no rebound no guarding No lower extremity edema No rash  Data Reviewed: reviewed   CBC    Component Value Date/Time   WBC 5.7 12/19/2023 0425   RBC 3.34 (L) 12/19/2023 0425   HGB 10.4 (L) 12/19/2023 0425   HGB 14.4 12/16/2023 1431   HCT 31.3 (L) 12/19/2023 0425   HCT 42.9 12/16/2023 1431   PLT 272 12/19/2023 0425   PLT 220 12/16/2023 1431   MCV 93.7 12/19/2023 0425   MCV 92 12/16/2023 1431   MCH 31.1 12/19/2023 0425   MCHC 33.2 12/19/2023 0425   RDW 12.2 12/19/2023 0425   RDW 12.3 12/16/2023 1431   LYMPHSABS 3.0 12/19/2023 0425   LYMPHSABS 2.0 12/16/2023 1431   MONOABS 0.6 12/19/2023 0425   EOSABS 0.1 12/19/2023 0425   EOSABS 0.0 12/16/2023 1431   BASOSABS 0.0 12/19/2023 0425   BASOSABS 0.0 12/16/2023 1431      Latest Ref Rng & Units 12/19/2023  4:25 AM 12/18/2023    5:58 AM 12/17/2023    3:04 PM  CMP  Glucose 70 - 99 mg/dL 99  161  096   BUN 6 - 20 mg/dL 11  22  11    Creatinine 0.61 - 1.24 mg/dL 0.45  4.09  8.11   Sodium 135 - 145 mmol/L 136  136  133   Potassium 3.5 - 5.1 mmol/L 3.7  4.3  4.1   Chloride 98 - 111 mmol/L 107  102  99   CO2 22 - 32 mmol/L 25  27  26    Calcium 8.9 - 10.3 mg/dL 8.3  8.7  9.1   Total Protein 6.5 - 8.1 g/dL 6.3  7.4  8.2   Total Bilirubin 0.0 - 1.2 mg/dL 0.6  0.6  0.9   Alkaline Phos 38 - 126 U/L 33  34  37   AST 15 - 41 U/L 440  636  910   ALT 0 - 44 U/L 204  236   267     Scheduled Meds:  enoxaparin (LOVENOX) injection  40 mg Subcutaneous Q24H   Continuous Infusions:  Time  35  Rhetta Mura, MD  [facility]

## 2023-12-20 DIAGNOSIS — R7989 Other specified abnormal findings of blood chemistry: Secondary | ICD-10-CM

## 2023-12-20 LAB — CBC WITH DIFFERENTIAL/PLATELET
Abs Immature Granulocytes: 0.01 10*3/uL (ref 0.00–0.07)
Basophils Absolute: 0 10*3/uL (ref 0.0–0.1)
Basophils Relative: 0 %
Eosinophils Absolute: 0.1 10*3/uL (ref 0.0–0.5)
Eosinophils Relative: 2 %
HCT: 32.6 % — ABNORMAL LOW (ref 39.0–52.0)
Hemoglobin: 10.8 g/dL — ABNORMAL LOW (ref 13.0–17.0)
Immature Granulocytes: 0 %
Lymphocytes Relative: 60 %
Lymphs Abs: 3.6 10*3/uL (ref 0.7–4.0)
MCH: 31.2 pg (ref 26.0–34.0)
MCHC: 33.1 g/dL (ref 30.0–36.0)
MCV: 94.2 fL (ref 80.0–100.0)
Monocytes Absolute: 0.6 10*3/uL (ref 0.1–1.0)
Monocytes Relative: 10 %
Neutro Abs: 1.8 10*3/uL (ref 1.7–7.7)
Neutrophils Relative %: 28 %
Platelets: 336 10*3/uL (ref 150–400)
RBC: 3.46 MIL/uL — ABNORMAL LOW (ref 4.22–5.81)
RDW: 12.1 % (ref 11.5–15.5)
WBC: 6.2 10*3/uL (ref 4.0–10.5)
nRBC: 0 % (ref 0.0–0.2)

## 2023-12-20 LAB — COMPREHENSIVE METABOLIC PANEL
ALT: 200 U/L — ABNORMAL HIGH (ref 0–44)
AST: 312 U/L — ABNORMAL HIGH (ref 15–41)
Albumin: 3 g/dL — ABNORMAL LOW (ref 3.5–5.0)
Alkaline Phosphatase: 34 U/L — ABNORMAL LOW (ref 38–126)
Anion gap: 7 (ref 5–15)
BUN: 10 mg/dL (ref 6–20)
CO2: 25 mmol/L (ref 22–32)
Calcium: 8.2 mg/dL — ABNORMAL LOW (ref 8.9–10.3)
Chloride: 108 mmol/L (ref 98–111)
Creatinine, Ser: 0.6 mg/dL — ABNORMAL LOW (ref 0.61–1.24)
GFR, Estimated: >60 mL/min (ref >60)
Glucose, Bld: 101 mg/dL — ABNORMAL HIGH (ref 70–99)
Potassium: 3.6 mmol/L (ref 3.5–5.1)
Sodium: 140 mmol/L (ref 135–145)
Total Bilirubin: 0.7 mg/dL (ref 0.0–1.2)
Total Protein: 6.4 g/dL — ABNORMAL LOW (ref 6.5–8.1)

## 2023-12-20 LAB — CK: Total CK: 4893 U/L — ABNORMAL HIGH (ref 49–397)

## 2023-12-20 MED ORDER — BENZOCAINE 10 % MT GEL: Freq: Two times a day (BID) | OROMUCOSAL | 0 refills | Status: DC | PRN

## 2023-12-20 NOTE — Discharge Summary (Signed)
Physician Discharge Summary  Dale Moyer VOZ:[id number] DOB: 10-14-2001 DOA: 12/17/2023  PCP: Erick Alley, DO  Admit date: 12/17/2023 Discharge date: 12/20/2023  Time spent: 33 minutes  Recommendations for Outpatient Follow-up:  Needs outpatient LFTs in 2 to 3 days as well as CK repeated already has appointment with Dr. Yetta Barre who is aware of patient discharge Would recommend possible workup with autoimmune panel/liver biopsy depending on labs-would time autoimmune panel within the next 2 weeks once he completely recovers Patient has been recommended not to use creatinine or supplements for bodybuilding at this time but instead wait for some time until resuming   Discharge Diagnoses:  MAIN problem for hospitalization   Rhabdomyolysis and transaminitis secondary to Accutane  Please see below for itemized issues addressed in HOpsital- refer to other progress notes for clarity if needed  Discharge Condition: Improved  Diet recommendation: Regular  Filed Weights   12/17/23 1421  Weight: 53.5 kg    History of present illness:   23 year old male with severe acne-seen at family medicine clinic 11/23/2023 prescribed doxycycline and adapalene and was referred to dermatology Dermatologist prescribed Accutane which patient started taking on 1/17-he also started taking creatine supplement over several weeks Followed up with family medicine clinic on 1/28 with URI symptoms sore throat bilateral knee pain diarrhea-- screening labs including CBC Chem-12 showed significantly elevated LFTs AST/ALT 365/94 with CK20 7000 LDH 900-patient was advised to stop the Accutane Patient referred to emergency room by PCP came to [facility]   GI consulted   Procedures   Plan   Transaminitis in the setting of recent rash and recent start Accutane Unclear precipitating event but sounds very suspicious for either side effect of Accutane and or effect of creatinine powder-uses "Raw" brand [didn't notice any  other excipient/ingredient that might have caused any issues with liver[ Ceruloplasmin level 34 (normal is 32), ANA[+], IgG 1888, alpha-1 antitrypsin elevated at 202--- not sure if there is an autoimmune component presentation Discussed case with gastroenterology who feels it may be DILI overlap with autoimmune-as long as liver function seems to be improving okay for outpatient follow-up workup May require outpatient consideration for referral, repeat autoimmune labs and liver biopsy if recurs Rhabdo on admission CK level 27,000 t---trended down to ~ 4900 at d/c and will require OP 1 time follow-up labs Completed Volume resus 150 cc/h and have counseled to drink > 3 liters of water for several days to clear out CK and prevent kidney damage---see above re: Creatinine supplement Severe acne In the future should not take any further Accutane products unless we know that this was not the cause    Discharge Exam: Vitals:   12/19/23 2100 12/20/23 0440  BP: 117/67 (!) 103/57  Pulse: 80 (!) 57  Resp: 16 16  Temp: 98 F (36.7 C) 97.7 F (36.5 C)  SpO2: 100% 100%    Subj on day of d/c   Awake coherent looks well feels fair no n/v/cp Passing reasonable amounts of urine No fever  General Exam on discharge  Eomi ncat no ict pallor Cta b no wheeze rales rhonchi Abd soft nt nd no rebound no hepatomegaly Neuro intact moving limbs x 4 without deficit  Discharge Instructions   Discharge Instructions     Diet - low sodium heart healthy   Complete by: As directed    Discharge instructions   Complete by: As directed    You will need to follow-up with your primary physician for repeat liver function tests as well as  recheck of creatinine kinase in the outpatient setting as we feel that the medication Accutane probably cause some severe alterations in your liver and muscle proteins I do think that you can probably use different medications, I think that if you have any other issues you can  follow-up with family medicine----the gastroenterology specialist can probably follow-up with you in the outpatient setting if there seems to be a need and I would recommend you get repeat labs once you stabilize (in about 2 weeks) to rule out autoimmune process causing these issues   Increase activity slowly   Complete by: As directed       Allergies as of 12/20/2023       Reactions   Pork-derived Products Other (See Comments)   Religious preference        Medication List     STOP taking these medications    Adapalene-Benzoyl Peroxide 0.1-2.5 % gel   cetirizine 10 MG tablet Commonly known as: ZYRTEC   diphenhydrAMINE 25 MG tablet Commonly known as: BENADRYL   doxycycline 100 MG tablet Commonly known as: VIBRA-TABS       TAKE these medications    benzocaine 10 % mucosal gel Commonly known as: ORAJEL Use as directed in the mouth or throat 2 (two) times daily as needed for mouth pain.   ibuprofen 200 MG tablet Commonly known as: Advil Take 2 tablets (400 mg total) by mouth every 6 (six) hours as needed for mild pain or moderate pain.       Allergies  Allergen Reactions   Pork-Derived Products Other (See Comments)    Religious preference    Follow-up Information     Lynann Bologna, DO. Call in 3 day(s).   Specialty: Gastroenterology Why: Please call [facility] next week to schedule appointment with Dr. Lorenso Quarry for hospital follow up in roughly 1 month. Please ensure that PCP labs are sent to Dr. Earlie Counts office (via fax) for review. Recommend against further Accutane use. Contact information: [address] [place] [address] [phone]                  The results of significant diagnostics from this hospitalization (including imaging, microbiology, ancillary and laboratory) are listed below for reference.    Significant Diagnostic Studies: US Abdomen Limited RUQ (LIVER/GB) Result Date: 12/17/2023 CLINICAL DATA:  Elevated  liver function tests EXAM: ULTRASOUND ABDOMEN LIMITED RIGHT UPPER QUADRANT COMPARISON:  09/09/2009 CT FINDINGS: Gallbladder: No gallstones or wall thickening visualized. No sonographic Murphy sign noted by sonographer. Common bile duct: Diameter: Normal, 1 mm Liver: No focal lesion identified. Within normal limits in parenchymal echogenicity. Portal vein is patent on color Doppler imaging with normal direction of blood flow towards the liver. Other: None. IMPRESSION: Normal right upper quadrant ultrasound. No explanation for elevated liver function tests. Electronically Signed   By: Jeronimo Greaves M.D.   On: 12/17/2023 17:23    Microbiology: No results found for this or any previous visit (from the past 240 hours).   Labs: Basic Metabolic Panel: Recent Labs  Lab 12/14/23 1619 12/17/23 1504 12/18/23 0558 12/19/23 0425 12/20/23 0357  NA 136 133* 136 136 140  K 4.7 4.1 4.3 3.7 3.6  CL 99 99 102 107 108  CO2 24 26 27 25 25   GLUCOSE 98 104* 101* 99 101*  BUN 10 11 22* 11 10  CREATININE 0.91 0.58* 0.59* 0.37* 0.60*  CALCIUM 9.0 9.1 8.7* 8.3* 8.2*   Liver Function Tests: Recent Labs  Lab 12/16/23 1431 12/17/23 1504 12/18/23 0558 12/19/23 0425 12/20/23 0357  AST 901* 910* 636* 440* 312*  ALT 228* 267* 236* 204* 200*  ALKPHOS 51 37* 34* 33* 34*  BILITOT 0.5 0.9 0.6 0.6 0.7  PROT 8.1 8.2* 7.4 6.3* 6.4*  ALBUMIN 4.6 4.0 3.6 3.1* 3.0*   Recent Labs  Lab 12/17/23 1504  LIPASE 29   No results for input(s): "AMMONIA" in the last 168 hours. CBC: Recent Labs  Lab 12/14/23 1619 12/16/23 1431 12/17/23 1504 12/19/23 0425 12/20/23 0357  WBC 3.4 4.7 4.7 5.7 6.2  NEUTROABS 2.0 2.2  --  1.9 1.8  HGB 13.9 14.4 13.3 10.4* 10.8*  HCT 40.3 42.9 40.1 31.3* 32.6*  MCV 91 92 92.2 93.7 94.2  PLT 174 220 243 272 336   Cardiac Enzymes: Recent Labs  Lab 12/17/23 1504 12/19/23 0425 12/20/23 0357  CKTOTAL 27,727* 9,247* 4,893*   BNP: BNP (last 3 results) No results for input(s): "BNP"  in the last 8760 hours.  ProBNP (last 3 results) No results for input(s): "PROBNP" in the last 8760 hours.  CBG: No results for input(s): "GLUCAP" in the last 168 hours.  Signed:  Rhetta Mura MD   [facility] 12/20/2023, 9:54 AM

## 2023-12-20 NOTE — Progress Notes (Signed)
AVS reviewed w/ pt who verbalized an understanding. No other questions at this time. PIV removed as noted by this RN. Pt dressed for d/c to home. To lobby - home w/ dad

## 2023-12-20 NOTE — Plan of Care (Signed)

## 2023-12-21 LAB — HSV DNA BY PCR (REFERENCE LAB)
HSV 1 DNA: NEGATIVE
HSV 2 DNA: NEGATIVE

## 2023-12-22 ENCOUNTER — Encounter: Payer: Self-pay | Admitting: Student

## 2023-12-22 ENCOUNTER — Ambulatory Visit (INDEPENDENT_AMBULATORY_CARE_PROVIDER_SITE_OTHER): Payer: Medicaid Other | Admitting: Student

## 2023-12-22 VITALS — BP 105/64 | HR 72 | Ht 67.0 in | Wt 122.6 lb

## 2023-12-22 DIAGNOSIS — R748 Abnormal levels of other serum enzymes: Secondary | ICD-10-CM

## 2023-12-22 LAB — CMV DNA BY PCR, QUALITATIVE: CMV DNA, Qual PCR: NEGATIVE

## 2023-12-22 NOTE — Patient Instructions (Addendum)
 It was great to see you! Thank you for allowing me to participate in your care!   Our plans for today:  - Call [facility] Kriss Estefana DEL, DO to schedule appointment with Dr. Kriss for hospital follow up in roughly 1 month.  - Once you give us  permission we can send labs o Dr. Herminia office (via fax) for review - Recommend against further Accutane use.  - Checking LFTs and CK today  Take care and seek immediate care sooner if you develop any concerns.  Wendel Lesch, MD

## 2023-12-22 NOTE — Progress Notes (Signed)
    SUBJECTIVE:   CHIEF COMPLAINT / HPI: Hospital F/u  Admitted 1/31-2/3 for elevated LFTs and rhabo likely due to recent starting of Accutane.  Also was taking creatine powder.  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 Joshua who is aware of patient discharge Would recommend possible workup with autoimmune panel/liver biopsy depending on labs-would time autoimmune panel within the next 2 weeks once he completely recovers Patient has been recommended not to use creatinine or supplements for bodybuilding at this time but instead wait for some time until resuming  Per [facility]: He can follow up with me in office in roughly 4 weeks (please have PCP send labs to my office). Will place my follow up information in chart for discharge.  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 Would not repeat use of Accutane. If liver enzymes stagnate in the outpatient setting, would recommend IR evaluation for liver biopsy and consider steroid and azathioprine medication therapy.   Has some mild ongoing soreness in the upper legs and back, and discomfort in the right stomach area.  Not taking Accutane or any supplements/Creatine Had 1 episode of emesis but none since. Hydrating well and urinating normally  PERTINENT  PMH / PSH: acne  OBJECTIVE:   BP 105/64   Pulse 72   Ht 5' 7 (1.702 m)   Wt 122 lb 9.6 oz (55.6 kg)   SpO2 100%   BMI 19.20 kg/m   General: Well appearing, NAD, awake, alert, responsive to questions Head: Normocephalic atraumatic CV: Regular rate and rhythm no murmurs rubs or gallops Respiratory: Clear to ausculation bilaterally, no wheezes rales or crackles, chest rises symmetrically,  no increased work of  breathing Abdomen: Soft, non-tender - mild discomfort over RUQ, non-distended, normoactive bowel sounds  Extremities: Moves upper and lower extremities freely, no edema in LE, no tenderness to palpation of upper thigh and lower back  ASSESSMENT/PLAN:   Assessment & Plan Elevated liver enzymes Likely in the setting of Accutane, recent hospitalization.  Possible autoimmune component as well.  Did also have rhabdomyolysis in hospital possibly related to increased exertion prior to admission. -CMP -CK -Frequent hydration -Avoid hepatotoxic agents (off accutane) -[facility] number provided in AVS to set up appointment -If LFTs stagnate could consider IR referral for biopsy/steroids/azathioprine with Dr. Herminia recs   Dale Lesch, MD [facility]

## 2023-12-23 LAB — COMPREHENSIVE METABOLIC PANEL
ALT: 274 [IU]/L — ABNORMAL HIGH (ref 0–44)
AST: 253 [IU]/L — ABNORMAL HIGH (ref 0–40)
Albumin: 4 g/dL — ABNORMAL LOW (ref 4.3–5.2)
Alkaline Phosphatase: 53 [IU]/L (ref 44–121)
BUN/Creatinine Ratio: 13 (ref 9–20)
BUN: 8 mg/dL (ref 6–20)
Bilirubin Total: 0.5 mg/dL (ref 0.0–1.2)
CO2: 24 mmol/L (ref 20–29)
Calcium: 8.9 mg/dL (ref 8.7–10.2)
Chloride: 103 mmol/L (ref 96–106)
Creatinine, Ser: 0.64 mg/dL — ABNORMAL LOW (ref 0.76–1.27)
Globulin, Total: 3.2 g/dL (ref 1.5–4.5)
Glucose: 98 mg/dL (ref 70–99)
Potassium: 4.1 mmol/L (ref 3.5–5.2)
Sodium: 141 mmol/L (ref 134–144)
Total Protein: 7.2 g/dL (ref 6.0–8.5)
eGFR: 138 mL/min/{1.73_m2} (ref >59)

## 2023-12-23 LAB — CK: Total CK: 2803 U/L (ref 49–439)

## 2023-12-27 ENCOUNTER — Encounter: Payer: Self-pay | Admitting: Student

## 2023-12-27 ENCOUNTER — Telehealth: Payer: Self-pay | Admitting: Student

## 2023-12-27 ENCOUNTER — Telehealth: Payer: Self-pay

## 2023-12-27 ENCOUNTER — Ambulatory Visit: Payer: BLUE CROSS/BLUE SHIELD | Admitting: Student

## 2023-12-27 DIAGNOSIS — R748 Abnormal levels of other serum enzymes: Secondary | ICD-10-CM

## 2023-12-27 NOTE — Telephone Encounter (Signed)
 Called patient and confirmed date of birth.  Discussed that I spoke with Dr. Oswaldo Blessing with [facility] who recommended referral to IR for liver biopsy at this point.  Stated that I would route this message to himself and Dr. Rochelle Chu.  He has another follow-up with her today as well.  He is set up a GI appointment at the end of this month.  CMA team - can you help schedule this for patient?

## 2023-12-27 NOTE — Telephone Encounter (Signed)
 Patient is scheduled for US  on 02/25 @1pm 

## 2023-12-27 NOTE — Progress Notes (Deleted)
    SUBJECTIVE:   CHIEF COMPLAINT / HPI:   Elevated LFTs Last seen by Dr. Willene Hatchet at [facility] on 12/22/2023 for hospital follow-up after being hospitalized for transaminitis and rhabdo in setting of recently starting Accutane, taking creatine supplement, and strenuous exertion.  CK significantly down trended.  LFTs were somewhat stagnant, ZOX096>045, ALT 200>274.  Per documented communication with Dr. Willene Hatchet, Dr. Lorenso Quarry with [facility] recommends referral to IR for liver biopsy which Dr. Willene Hatchet has ordered. Today pt states*** Has scheduled appointment with [facility]***  PERTINENT  PMH / PSH: ***  OBJECTIVE:   There were no vitals taken for this visit. ***  General: NAD, pleasant, able to participate in exam Cardiac: RRR, no murmurs. Respiratory: CTAB, normal effort, No wheezes, rales or rhonchi Abdomen: Bowel sounds present, nontender, nondistended, no hepatosplenomegaly. Extremities: no edema or cyanosis. Skin: warm and dry, no rashes noted Neuro: alert, no obvious focal deficits Psych: Normal affect and mood  ASSESSMENT/PLAN:   No problem-specific Assessment & Plan notes found for this encounter.   In setting of recently taking Accutane, also concern for possibility of autoimmune hepatitis.  Dr. Erick Alley, DO [facility]    {    This will disappear when note is signed, click to select method of visit    :1}

## 2023-12-27 NOTE — Telephone Encounter (Signed)
-----   Message from Renaye Carp sent at 12/24/2023  6:25 PM EST ----- Regarding: RE: Elevated LFTs I would recommend referral to IR for liver biopsy.   -CV ----- Message ----- From: Genora Kidd, MD Sent: 12/24/2023   6:02 PM EST To: Renaye Carp, DO; Glenn Lange, DO Subject: Elevated LFTs                                  Hi Dr. Oswaldo Blessing,  I saw this patient in a hospital follow-up after admission for elevated LFTs.  His LFTs somewhat stagnated around the 200s.  I was wondering whether you would recommend referral for IR liver biopsy at this point.  Please let me know.  He has another follow-up in 1 week with Dr. Rochelle Chu as well.  Genora Kidd, MD FM PGY-3

## 2023-12-29 ENCOUNTER — Ambulatory Visit (INDEPENDENT_AMBULATORY_CARE_PROVIDER_SITE_OTHER): Payer: Medicaid Other | Admitting: Student

## 2023-12-29 ENCOUNTER — Encounter: Payer: Self-pay | Admitting: Student

## 2023-12-29 VITALS — BP 114/83 | HR 99 | Ht 65.0 in | Wt 119.8 lb

## 2023-12-29 DIAGNOSIS — D649 Anemia, unspecified: Secondary | ICD-10-CM | POA: Diagnosis not present

## 2023-12-29 DIAGNOSIS — D509 Iron deficiency anemia, unspecified: Secondary | ICD-10-CM

## 2023-12-29 DIAGNOSIS — N912 Amenorrhea, unspecified: Secondary | ICD-10-CM | POA: Insufficient documentation

## 2023-12-29 DIAGNOSIS — R7989 Other specified abnormal findings of blood chemistry: Secondary | ICD-10-CM | POA: Diagnosis not present

## 2023-12-29 NOTE — Patient Instructions (Addendum)
It was great to see you! Thank you for allowing me to participate in your care!  Our plans for today:  - continue with plan to get liver biopsy - would not take Accutane (likely contributed to liver issues) - Do not take more than 2g of tylenol in a 24 hours period (can take 500 mg every 6 hours as needed)   Specialty: Gastroenterology Why: Please call [facility] next week to schedule appointment with Dr. Lorenso Quarry for hospital follow up in roughly 1 month. Please ensure that PCP labs are sent to Dr. Earlie Counts office (via fax) for review. Recommend against further Accutane use. Contact information: [address] [place] [address] [phone]     We are checking some labs today, I will call you if they are abnormal will send you a MyChart message or a letter if they are normal.  If you do not hear about your labs in the next 2 weeks please let us know.  Take care and seek immediate care sooner if you develop any concerns.   Dr. Erick Alley, DO [facility]

## 2023-12-29 NOTE — Assessment & Plan Note (Signed)
Normocytic anemia with hemoglobin 10.8 during hospitalization.  Could have been delusional in setting of receiving fluids for rhabdo.  No known sources of bleeding at the moment and patient feels well. -CBC today, if hemoglobin still low will have patient return for iron studies

## 2023-12-29 NOTE — Progress Notes (Signed)
    SUBJECTIVE:   CHIEF COMPLAINT / HPI:   Elevated LFTs Last seen by Dr. Willene Hatchet at [facility] on 12/22/2023 for hospital follow-up after being hospitalized for transaminitis and rhabdo in setting of recently starting Accutane, taking creatine supplement, and strenuous exertion.  CK significantly down trended.  LFTs were somewhat stagnant, [id number], ALT 200>274.  Per documented communication with Dr. Willene Hatchet, Dr. Lorenso Quarry with [facility] recommends referral to IR for liver biopsy which Dr. Willene Hatchet has ordered. Today pt states he is feeling well and like his normal self He is unsure if he has a scheduled appointment with [facility] at the end of the month  PERTINENT  PMH / PSH: Depression, elevated LFTs  OBJECTIVE:   BP 114/83   Pulse 99   Ht 5\' 5"  (1.651 m)   Wt 119 lb 12.8 oz (54.3 kg)   SpO2 100%   BMI 19.94 kg/m    General: NAD, pleasant, able to participate in exam Cardiac: RRR, no murmurs. Respiratory: CTAB, normal effort, No wheezes, rales or rhonchi Abdomen: Bowel sounds present, nontender, nondistended, soft Skin: warm and dry, no rashes noted Neuro: alert, no obvious focal deficits Psych: Normal affect and mood  ASSESSMENT/PLAN:   Abnormal LFTs Accutane could have played a role but may also have underlying autoimmune disorder.  Currently asymptomatic and doing well. -Will trend liver enzymes with CMP today -Continue with plan for liver biopsy -Do not start back on Accutane -Patient given contact information to schedule appointment with [facility] for later this month -Limit use of Tylenol, avoid alcohol and NSAIDs -Patient asked if he can use Zyrtec, discussed to half the dose to 5 mg once daily as needed -Return after GI appointment or sooner if needed  Amenia Normocytic anemia with hemoglobin 10.8 during hospitalization.  Could have been delusional in setting of receiving fluids for rhabdo.  No known sources of bleeding at the moment and patient feels well. -CBC today, if  hemoglobin still low will have patient return for iron studies    Dr. Erick Alley, DO [facility]

## 2023-12-29 NOTE — Assessment & Plan Note (Addendum)
Accutane could have played a role but may also have underlying autoimmune disorder.  Currently asymptomatic and doing well. -Will trend liver enzymes with CMP today -Continue with plan for liver biopsy -Do not start back on Accutane -Patient given contact information to schedule appointment with [facility] for later this month -Limit use of Tylenol, avoid alcohol and NSAIDs -Patient asked if he can use Zyrtec, discussed to half the dose to 5 mg once daily as needed -Return after GI appointment or sooner if needed

## 2023-12-30 ENCOUNTER — Encounter: Payer: Self-pay | Admitting: Student

## 2023-12-30 DIAGNOSIS — L700 Acne vulgaris: Secondary | ICD-10-CM | POA: Diagnosis not present

## 2023-12-30 LAB — COMPREHENSIVE METABOLIC PANEL
ALT: 100 [IU]/L — ABNORMAL HIGH (ref 0–44)
AST: 58 [IU]/L — ABNORMAL HIGH (ref 0–40)
Albumin: 4.5 g/dL (ref 4.3–5.2)
Alkaline Phosphatase: 60 [IU]/L (ref 44–121)
BUN/Creatinine Ratio: 9 (ref 9–20)
BUN: 7 mg/dL (ref 6–20)
Bilirubin Total: 0.9 mg/dL (ref 0.0–1.2)
CO2: 22 mmol/L (ref 20–29)
Calcium: 9.3 mg/dL (ref 8.7–10.2)
Chloride: 101 mmol/L (ref 96–106)
Creatinine, Ser: 0.81 mg/dL (ref 0.76–1.27)
Globulin, Total: 3.3 g/dL (ref 1.5–4.5)
Glucose: 119 mg/dL — ABNORMAL HIGH (ref 70–99)
Potassium: 4.1 mmol/L (ref 3.5–5.2)
Sodium: 138 mmol/L (ref 134–144)
Total Protein: 7.8 g/dL (ref 6.0–8.5)
eGFR: 129 mL/min/{1.73_m2} (ref >59)

## 2023-12-30 LAB — CBC
Hematocrit: 36.6 % — ABNORMAL LOW (ref 37.5–51.0)
Hemoglobin: 12 g/dL — ABNORMAL LOW (ref 13.0–17.7)
MCH: 30.8 pg (ref 26.6–33.0)
MCHC: 32.8 g/dL (ref 31.5–35.7)
MCV: 94 fL (ref 79–97)
Platelets: 387 10*3/uL (ref 150–450)
RBC: 3.9 x10E6/uL — ABNORMAL LOW (ref 4.14–5.80)
RDW: 11.8 % (ref 11.6–15.4)
WBC: 3.5 10*3/uL (ref 3.4–10.8)

## 2024-01-10 ENCOUNTER — Other Ambulatory Visit: Payer: Self-pay | Admitting: Radiology

## 2024-01-10 DIAGNOSIS — R7989 Other specified abnormal findings of blood chemistry: Secondary | ICD-10-CM

## 2024-01-11 ENCOUNTER — Ambulatory Visit (HOSPITAL_COMMUNITY)
Admission: RE | Admit: 2024-01-11 | Discharge: 2024-01-11 | Disposition: A | Discharge status: Home / Self Care | Payer: Medicaid Other | Source: Ambulatory Visit | Attending: Family Medicine | Admitting: Family Medicine

## 2024-01-11 ENCOUNTER — Encounter (HOSPITAL_COMMUNITY): Payer: Self-pay

## 2024-01-11 DIAGNOSIS — R7989 Other specified abnormal findings of blood chemistry: Secondary | ICD-10-CM | POA: Diagnosis not present

## 2024-01-11 DIAGNOSIS — R748 Abnormal levels of other serum enzymes: Secondary | ICD-10-CM

## 2024-01-11 DIAGNOSIS — K7689 Other specified diseases of liver: Secondary | ICD-10-CM | POA: Diagnosis not present

## 2024-01-11 DIAGNOSIS — R945 Abnormal results of liver function studies: Secondary | ICD-10-CM | POA: Diagnosis not present

## 2024-01-11 DIAGNOSIS — R7401 Elevation of levels of liver transaminase levels: Secondary | ICD-10-CM | POA: Diagnosis not present

## 2024-01-11 LAB — CBC WITH DIFFERENTIAL/PLATELET
Abs Immature Granulocytes: 0.02 10*3/uL (ref 0.00–0.07)
Basophils Absolute: 0 10*3/uL (ref 0.0–0.1)
Basophils Relative: 0 %
Eosinophils Absolute: 0.1 10*3/uL (ref 0.0–0.5)
Eosinophils Relative: 2 %
HCT: 37 % — ABNORMAL LOW (ref 39.0–52.0)
Hemoglobin: 12.3 g/dL — ABNORMAL LOW (ref 13.0–17.0)
Immature Granulocytes: 0 %
Lymphocytes Relative: 42 %
Lymphs Abs: 1.9 10*3/uL (ref 0.7–4.0)
MCH: 31.6 pg (ref 26.0–34.0)
MCHC: 33.2 g/dL (ref 30.0–36.0)
MCV: 95.1 fL (ref 80.0–100.0)
Monocytes Absolute: 0.4 10*3/uL (ref 0.1–1.0)
Monocytes Relative: 10 %
Neutro Abs: 2.1 10*3/uL (ref 1.7–7.7)
Neutrophils Relative %: 46 %
Platelets: 198 10*3/uL (ref 150–400)
RBC: 3.89 MIL/uL — ABNORMAL LOW (ref 4.22–5.81)
RDW: 12.8 % (ref 11.5–15.5)
WBC: 4.6 10*3/uL (ref 4.0–10.5)
nRBC: 0 % (ref 0.0–0.2)

## 2024-01-11 LAB — PROTIME-INR
INR: 1.2 (ref 0.8–1.2)
Prothrombin Time: 15.2 s (ref 11.4–15.2)

## 2024-01-11 MED ORDER — MIDAZOLAM HCL 2 MG/2ML IJ SOLN
INTRAMUSCULAR | Status: AC | PRN
  Administered 2024-01-11: 2 mg via INTRAVENOUS

## 2024-01-11 MED ORDER — LIDOCAINE HCL 1 % IJ SOLN
INTRAMUSCULAR | Status: AC
  Filled 2024-01-11: qty 20

## 2024-01-11 MED ORDER — GELATIN ABSORBABLE 12-7 MM EX MISC
CUTANEOUS | Status: AC
  Filled 2024-01-11: qty 1

## 2024-01-11 MED ORDER — MIDAZOLAM HCL 2 MG/2ML IJ SOLN
INTRAMUSCULAR | Status: AC
  Filled 2024-01-11: qty 4

## 2024-01-11 MED ORDER — FENTANYL CITRATE (PF) 100 MCG/2ML IJ SOLN
INTRAMUSCULAR | Status: AC
  Filled 2024-01-11: qty 2

## 2024-01-11 MED ORDER — SODIUM CHLORIDE 0.9 % IV SOLN: INTRAVENOUS | Status: DC

## 2024-01-11 MED ORDER — FENTANYL CITRATE (PF) 100 MCG/2ML IJ SOLN
INTRAMUSCULAR | Status: AC | PRN
  Administered 2024-01-11 (×2): 50 ug via INTRAVENOUS

## 2024-01-11 NOTE — Procedures (Signed)
 Interventional Radiology Procedure Note  Procedure: Ultrasound guided liver biopsy   Findings: Please refer to procedural dictation for full description. 18 ga core x2 from right lobe.  Complications: None immediate  Estimated Blood Loss: < 5 ml  Recommendations: Strict 3 hour bedrest. Follow Pathology results.   Marliss Coots, MD

## 2024-01-11 NOTE — H&P (Signed)
 Chief Complaint: elevated LFTs - image guided random liver biopsy   Referring Provider(s): Levin Erp   Supervising Physician: Marliss Coots  Patient Status: [facility] - Out-pt  History of Present Illness: Dale Moyer is a 23 y.o. male with no significant medical history.  Pt as recently hospitalized on 1/31-2/3 for elevated LFTs and rhabdomyolysis likely due to the initiation of Accutane; pt also taking creatine powder. He is followed by Dr. Laroy Apple with [facility].  After discharge from the hospital, his LFTs remained high around 200 and he was referred to interventional radiology for an image guided random liver biopsy.    Patient is Full Code  Past Medical History:  Diagnosis Date   Seasonal allergies     Past Surgical History:  Procedure Laterality Date   APPENDECTOMY      Allergies: Pork-derived products  Medications: Prior to Admission medications   Medication Sig Start Date End Date Taking? Authorizing Provider  benzocaine (ORAJEL) 10 % mucosal gel Use as directed in the mouth or throat 2 (two) times daily as needed for mouth pain. 12/20/23   Rhetta Mura, MD     Family History  Problem Relation Age of Onset   Hypertension Mother    Hypertension Father    Diabetes Maternal Grandmother    Cancer - Cervical Maternal Grandmother    Diabetes Maternal Grandfather    Heart attack Maternal Grandfather    Breast cancer Maternal Aunt    Heart disease Maternal Uncle     Social History   Socioeconomic History   Marital status: Single    Spouse name: Not on file   Number of children: Not on file   Years of education: Not on file   Highest education level: Not on file  Occupational History   Not on file  Tobacco Use   Smoking status: Every Day    Types: E-cigarettes   Smokeless tobacco: Never  Vaping Use   Vaping status: Every Day  Substance and Sexual Activity   Alcohol use: Never   Drug use: Yes    Frequency: 7.0 times per week     Types: Marijuana   Sexual activity: Yes    Birth control/protection: None  Other Topics Concern   Not on file  Social History Narrative   Not on file   Social Drivers of Health   Financial Resource Strain: Not on file  Food Insecurity: No Food Insecurity (12/18/2023)   Hunger Vital Sign    Worried About Running Out of Food in the Last Year: Never true    Ran Out of Food in the Last Year: Never true  Transportation Needs: No Transportation Needs (12/18/2023)   PRAPARE - Administrator, Civil Service (Medical): No    Lack of Transportation (Non-Medical): No  Physical Activity: Not on file  Stress: Not on file  Social Connections: Not on file     Review of Systems: A 12 point ROS discussed and pertinent positives are indicated in the HPI above.  All other systems are negative.  Review of Systems  Constitutional:  Negative for fatigue and fever.  HENT:  Negative for congestion and dental problem.   Respiratory:  Negative for cough, chest tightness, shortness of breath and wheezing.   Cardiovascular:  Negative for chest pain.  Gastrointestinal:  Negative for diarrhea, nausea and vomiting.  Neurological:  Negative for light-headedness and headaches.  Psychiatric/Behavioral:  Negative for agitation, behavioral problems and confusion.     Vital Signs:  There were no vitals taken for this visit.  Advance Care Plan: The advanced care place/surrogate decision maker was discussed at the time of visit and the patient did not wish to discuss or was not able to name a surrogate decision maker or provide an advance care plan.  Physical Exam Constitutional:      Appearance: Normal appearance.  HENT:     Head: Normocephalic and atraumatic.     Mouth/Throat:     Mouth: Mucous membranes are moist.  Cardiovascular:     Rate and Rhythm: Normal rate and regular rhythm.  Pulmonary:     Effort: Pulmonary effort is normal.     Breath sounds: Normal breath sounds.  Abdominal:      General: Bowel sounds are normal.     Palpations: Abdomen is soft.  Musculoskeletal:        General: Normal range of motion.     Cervical back: Normal range of motion.  Skin:    General: Skin is warm and dry.  Neurological:     Mental Status: He is alert and oriented to person, place, and time.  Psychiatric:        Mood and Affect: Mood normal.        Behavior: Behavior normal.     Imaging: US Abdomen Limited RUQ (LIVER/GB) Result Date: 12/17/2023 CLINICAL DATA:  Elevated liver function tests EXAM: ULTRASOUND ABDOMEN LIMITED RIGHT UPPER QUADRANT COMPARISON:  09/09/2009 CT FINDINGS: Gallbladder: No gallstones or wall thickening visualized. No sonographic Murphy sign noted by sonographer. Common bile duct: Diameter: Normal, 1 mm Liver: No focal lesion identified. Within normal limits in parenchymal echogenicity. Portal vein is patent on color Doppler imaging with normal direction of blood flow towards the liver. Other: None. IMPRESSION: Normal right upper quadrant ultrasound. No explanation for elevated liver function tests. Electronically Signed   By: Jeronimo Greaves M.D.   On: 12/17/2023 17:23    Labs:  CBC: Recent Labs    12/17/23 1504 12/19/23 0425 12/20/23 0357 12/29/23 1202  WBC 4.7 5.7 6.2 3.5  HGB 13.3 10.4* 10.8* 12.0*  HCT 40.1 31.3* 32.6* 36.6*  PLT 243 272 336 387    COAGS: Recent Labs    12/17/23 1720 12/18/23 0558  INR 1.2 1.2  APTT 34  --     BMP: Recent Labs    12/17/23 1504 12/18/23 0558 12/19/23 0425 12/20/23 0357 12/22/23 1003 12/29/23 1151  NA 133* 136 136 140 141 138  K 4.1 4.3 3.7 3.6 4.1 4.1  CL 99 102 107 108 103 101  CO2 26 27 25 25 24 22   GLUCOSE 104* 101* 99 101* 98 119*  BUN 11 22* 11 10 8 7   CALCIUM 9.1 8.7* 8.3* 8.2* 8.9 9.3  CREATININE 0.58* 0.59* 0.37* 0.60* 0.64* 0.81  GFRNONAA >60 >60 >60 >60  --   --     LIVER FUNCTION TESTS: Recent Labs    12/19/23 0425 12/20/23 0357 12/22/23 1003 12/29/23 1151  BILITOT 0.6 0.7 0.5  0.9  AST 440* 312* 253* 58*  ALT 204* 200* 274* 100*  ALKPHOS 33* 34* 53 60  PROT 6.3* 6.4* 7.2 7.8  ALBUMIN 3.1* 3.0* 4.0* 4.5    TUMOR MARKERS: No results for input(s): "AFPTM", "CEA", "CA199", "CHROMGRNA" in the last 8760 hours.  Assessment and Plan:  Pt with elevated LFTs after initiation of Accutane scheduled for image guided random liver biopsy 01/11/24.    Risks and benefits of image guided liver biopsy was discussed with the patient  and/or patient's family including, but not limited to bleeding, infection, damage to adjacent structures or low yield requiring additional tests.  All of the questions were answered and there is agreement to proceed.  Consent signed and in chart.  Thank you for allowing our service to participate in Muzammil Bruins 's care.  Electronically Signed: Loman Brooklyn, PA-C   01/11/2024, 9:11 AM    I spent a total of  30 Minutes   in face to face in clinical consultation, greater than 50% of which was counseling/coordinating care for image guided random liver biopsy.

## 2024-01-12 ENCOUNTER — Encounter: Payer: Self-pay | Admitting: Student

## 2024-01-12 LAB — SURGICAL PATHOLOGY

## 2024-01-13 DIAGNOSIS — R7401 Elevation of levels of liver transaminase levels: Secondary | ICD-10-CM | POA: Diagnosis not present

## 2024-01-13 DIAGNOSIS — K719 Toxic liver disease, unspecified: Secondary | ICD-10-CM | POA: Diagnosis not present

## 2024-02-03 DIAGNOSIS — L700 Acne vulgaris: Secondary | ICD-10-CM | POA: Diagnosis not present

## 2024-03-30 DIAGNOSIS — L700 Acne vulgaris: Secondary | ICD-10-CM | POA: Diagnosis not present

## 2024-03-30 DIAGNOSIS — L219 Seborrheic dermatitis, unspecified: Secondary | ICD-10-CM | POA: Diagnosis not present

## 2024-04-25 ENCOUNTER — Encounter: Payer: Self-pay | Admitting: *Deleted

## 2024-10-02 DIAGNOSIS — L209 Atopic dermatitis, unspecified: Secondary | ICD-10-CM | POA: Diagnosis not present

## 2024-10-02 DIAGNOSIS — L700 Acne vulgaris: Secondary | ICD-10-CM | POA: Diagnosis not present

## 2024-10-05 ENCOUNTER — Ambulatory Visit (HOSPITAL_COMMUNITY)
Admission: RE | Admit: 2024-10-05 | Discharge: 2024-10-05 | Disposition: A | Discharge status: Home / Self Care | Source: Ambulatory Visit | Attending: Family Medicine | Admitting: Family Medicine

## 2024-10-05 ENCOUNTER — Other Ambulatory Visit: Payer: Self-pay

## 2024-10-05 ENCOUNTER — Encounter (HOSPITAL_COMMUNITY): Payer: Self-pay

## 2024-10-05 VITALS — BP 108/65 | HR 68 | Temp 98.0°F | Resp 16

## 2024-10-05 DIAGNOSIS — Z113 Encounter for screening for infections with a predominantly sexual mode of transmission: Secondary | ICD-10-CM | POA: Diagnosis not present

## 2024-10-05 LAB — RPR: RPR Ser Ql: NONREACTIVE

## 2024-10-05 LAB — HIV ANTIBODY (ROUTINE TESTING W REFLEX): HIV Screen 4th Generation wRfx: NONREACTIVE

## 2024-10-05 NOTE — Discharge Instructions (Signed)
 You were seen today for STD screening.  Your blood work and swab will be resulted tomorrow.  You will be able to see these on mychart.  If there is anything abnormal or concerning you will be notified for treatment.

## 2024-10-05 NOTE — ED Triage Notes (Signed)
 Patient requesting std assessment.  Denies symptoms.

## 2024-10-05 NOTE — ED Provider Notes (Signed)
 [facility]    CSN: [id number] Arrival date & time: 10/05/24  1000      History   Chief Complaint Chief Complaint  Patient presents with   SEXUALLY TRANSMITTED DISEASE    HPI Dale Moyer is a 23 y.o. male.   Patient is here for STD testing.  Requesting cyto and blood work.  No known exposures.  No symptoms at this time.  No other issues.        Past Medical History:  Diagnosis Date   Seasonal allergies     Patient Active Problem List   Diagnosis Date Noted   Anemia 12/29/2023   Abnormal LFTs 12/17/2023   Skin nodule 06/11/2023   Fracture of radial head, left, closed 03/16/2023   body aches, rash and sore throat 11/13/2020   External hemorrhoids without complication 11/13/2020   Depression, major, single episode, moderate (HCC) 12/28/2019   Sprain of right rotator cuff capsule 12/28/2019   Seasonal allergies 09/07/2017    Past Surgical History:  Procedure Laterality Date   APPENDECTOMY         Home Medications    Prior to Admission medications   Medication Sig Start Date End Date Taking? Authorizing Provider  doxycycline  (ADOXA) 50 MG tablet Take by mouth 2 (two) times daily. DOES NOT KNOW MILLIGRAMS-STATES TAKING FOR ACNE   Yes [provider]  benzocaine  (ORAJEL) 10 % mucosal gel Use as directed in the mouth or throat 2 (two) times daily as needed for mouth pain. 12/20/23   Samtani, Jai-Gurmukh, MD    Family History Family History  Problem Relation Age of Onset   Hypertension Mother    Hypertension Father    Diabetes Maternal Grandmother    Cancer - Cervical Maternal Grandmother    Diabetes Maternal Grandfather    Heart attack Maternal Grandfather    Breast cancer Maternal Aunt    Heart disease Maternal Uncle     Social History Social History   Tobacco Use   Smoking status: Some Days    Types: E-cigarettes   Smokeless tobacco: Never  Vaping Use   Vaping status: Every Day  Substance Use Topics   Alcohol use:  Never   Drug use: Yes    Frequency: 7.0 times per week    Types: Marijuana     Allergies   Porcine (pork) protein-containing drug products   Review of Systems Review of Systems  Constitutional: Negative.   HENT: Negative.    Respiratory: Negative.    Cardiovascular: Negative.   Gastrointestinal: Negative.   Genitourinary: Negative.      Physical Exam Triage Vital Signs ED Triage Vitals  Encounter Vitals Group     BP 10/05/24 1021 108/65     Girls Systolic BP Percentile --      Girls Diastolic BP Percentile --      Boys Systolic BP Percentile --      Boys Diastolic BP Percentile --      Pulse Rate 10/05/24 1021 68     Resp 10/05/24 1021 16     Temp 10/05/24 1021 98 F (36.7 C)     Temp Source 10/05/24 1021 Oral     SpO2 10/05/24 1021 98 %     Weight --      Height --      Head Circumference --      Peak Flow --      Pain Score 10/05/24 1018 0     Pain Loc --      Pain  Education --      Exclude from [employer] Chart --    No data found.  Updated Vital Signs BP 108/65 (BP Location: Right Arm)   Pulse 68   Temp 98 F (36.7 C) (Oral)   Resp 16   SpO2 98%   Visual Acuity Right Eye Distance:   Left Eye Distance:   Bilateral Distance:    Right Eye Near:   Left Eye Near:    Bilateral Near:     Physical Exam Constitutional:      General: He is not in acute distress.    Appearance: Normal appearance. He is normal weight. He is not ill-appearing or toxic-appearing.  Cardiovascular:     Rate and Rhythm: Normal rate and regular rhythm.  Pulmonary:     Effort: Pulmonary effort is normal.     Breath sounds: Normal breath sounds.  Neurological:     General: No focal deficit present.     Mental Status: He is alert.  Psychiatric:        Mood and Affect: Mood normal.      UC Treatments / Results  Labs (all labs ordered are listed, but only abnormal results are displayed) Labs Reviewed  RPR  HIV ANTIBODY (ROUTINE TESTING W REFLEX)  CYTOLOGY, (ORAL,  ANAL, URETHRAL) ANCILLARY ONLY    EKG   Radiology No results found.  Procedures Procedures (including critical care time)  Medications Ordered in UC Medications - No data to display  Initial Impression / Assessment and Plan / UC Course  I have reviewed the triage vital signs and the nursing notes.  Pertinent labs & imaging results that were available during my care of the patient were reviewed by me and considered in my medical decision making (see chart for details).   Final Clinical Impressions(s) / UC Diagnoses   Final diagnoses:  Screening for STD (sexually transmitted disease)     Discharge Instructions      You were seen today for STD screening.  Your blood work and swab will be resulted tomorrow.  You will be able to see these on mychart.  If there is anything abnormal or concerning you will be notified for treatment.     ED Prescriptions   None    PDMP not reviewed this encounter.   Darral Longs, MD 10/05/24 1030

## 2024-10-06 LAB — CYTOLOGY, (ORAL, ANAL, URETHRAL) ANCILLARY ONLY
Chlamydia: NEGATIVE
Comment: NEGATIVE
Comment: NEGATIVE
Comment: NORMAL
Neisseria Gonorrhea: NEGATIVE
Trichomonas: NEGATIVE
